# Patient Record
Sex: Male | Born: 1970
Health system: Southern US, Community
[De-identification: ages and names within clinical notes are randomized; demographics above are authoritative.]

## PROBLEM LIST (undated history)

## (undated) DIAGNOSIS — T7840XA Allergy, unspecified, initial encounter: Secondary | ICD-10-CM

## (undated) DIAGNOSIS — E119 Type 2 diabetes mellitus without complications: Secondary | ICD-10-CM

## (undated) DIAGNOSIS — F32A Depression, unspecified: Secondary | ICD-10-CM

## (undated) DIAGNOSIS — F419 Anxiety disorder, unspecified: Secondary | ICD-10-CM

## (undated) DIAGNOSIS — R569 Unspecified convulsions: Secondary | ICD-10-CM

## (undated) DIAGNOSIS — F329 Major depressive disorder, single episode, unspecified: Secondary | ICD-10-CM

## (undated) HISTORY — DX: Unspecified convulsions: R56.9

## (undated) HISTORY — DX: Major depressive disorder, single episode, unspecified: F32.9

## (undated) HISTORY — DX: Depression, unspecified: F32.A

## (undated) HISTORY — DX: Anxiety disorder, unspecified: F41.9

## (undated) HISTORY — PX: WISDOM TOOTH EXTRACTION: SHX21

## (undated) HISTORY — DX: Allergy, unspecified, initial encounter: T78.40XA

---

## 2015-02-26 ENCOUNTER — Emergency Department (HOSPITAL_COMMUNITY)
Admission: EM | Admit: 2015-02-26 | Discharge: 2015-02-26 | Disposition: A | Discharge status: Home / Self Care | Payer: BLUE CROSS/BLUE SHIELD | Attending: Emergency Medicine | Admitting: Emergency Medicine

## 2015-02-26 ENCOUNTER — Encounter (HOSPITAL_COMMUNITY): Payer: Self-pay | Admitting: Emergency Medicine

## 2015-02-26 DIAGNOSIS — S39012A Strain of muscle, fascia and tendon of lower back, initial encounter: Secondary | ICD-10-CM

## 2015-02-26 DIAGNOSIS — M546 Pain in thoracic spine: Secondary | ICD-10-CM

## 2015-02-26 DIAGNOSIS — Y9289 Other specified places as the place of occurrence of the external cause: Secondary | ICD-10-CM | POA: Insufficient documentation

## 2015-02-26 DIAGNOSIS — Y9389 Activity, other specified: Secondary | ICD-10-CM | POA: Insufficient documentation

## 2015-02-26 DIAGNOSIS — S29012A Strain of muscle and tendon of back wall of thorax, initial encounter: Secondary | ICD-10-CM | POA: Insufficient documentation

## 2015-02-26 DIAGNOSIS — E119 Type 2 diabetes mellitus without complications: Secondary | ICD-10-CM | POA: Diagnosis not present

## 2015-02-26 DIAGNOSIS — X58XXXA Exposure to other specified factors, initial encounter: Secondary | ICD-10-CM | POA: Insufficient documentation

## 2015-02-26 DIAGNOSIS — S299XXA Unspecified injury of thorax, initial encounter: Secondary | ICD-10-CM | POA: Diagnosis present

## 2015-02-26 DIAGNOSIS — Y998 Other external cause status: Secondary | ICD-10-CM | POA: Insufficient documentation

## 2015-02-26 HISTORY — DX: Type 2 diabetes mellitus without complications: E11.9

## 2015-02-26 MED ORDER — DIAZEPAM 5 MG/ML IJ SOLN: 5.0000 mg | Freq: Once | INTRAMUSCULAR | Status: DC

## 2015-02-26 MED ORDER — DIAZEPAM 5 MG PO TABS
5.0000 mg | ORAL_TABLET | Freq: Once | ORAL | Status: AC
  Administered 2015-02-26: 5 mg via ORAL
  Filled 2015-02-26: qty 1

## 2015-02-26 MED ORDER — DIAZEPAM 5 MG PO TABS: 5.0000 mg | ORAL_TABLET | Freq: Two times a day (BID) | ORAL | Status: DC | PRN

## 2015-02-26 MED ORDER — KETOROLAC TROMETHAMINE 60 MG/2ML IM SOLN
60.0000 mg | Freq: Once | INTRAMUSCULAR | Status: AC
  Administered 2015-02-26: 60 mg via INTRAMUSCULAR
  Filled 2015-02-26: qty 2

## 2015-02-26 MED ORDER — TRAMADOL-ACETAMINOPHEN 37.5-325 MG PO TABS: 1.0000 | ORAL_TABLET | Freq: Four times a day (QID) | ORAL | Status: DC | PRN

## 2015-02-26 NOTE — ED Notes (Signed)
Lisa, PA at the bedside.  

## 2015-02-26 NOTE — ED Provider Notes (Signed)
CSN: [id number]     Arrival date & time 02/26/15  0536 History   First MD Initiated Contact with Patient 02/26/15 0601     Chief Complaint  Patient presents with  . Back Pain     (Consider location/radiation/quality/duration/timing/severity/associated sxs/prior Treatment) Patient is a 44 y.o. male presenting with back pain. The history is provided by the patient and medical records.  Back Pain   This is a 44 year old male with past medical history significant for diabetes, presenting to the ED for back pain. Patient states pain actually began on Thursday, 3 days ago, but worsened yesterday after roughhousing on the floor with his daughter.  He states he bent over to pick her up and felt a "pull" in his back and has had pain ever since.  He states he does have history of back problems in the past.  He denies numbness or weakness of his extremities.  No loss of bowel or bladder control.  Denies fever, chills, sweats, urinary symptoms.  No chest pain, SOB, or abdominal pain.    Past Medical History  Diagnosis Date  . Diabetes mellitus without complication    Past Surgical History  Procedure Laterality Date  . Wisdom tooth extraction     History reviewed. No pertinent family history. History  Substance Use Topics  . Smoking status: Never Smoker   . Smokeless tobacco: Not on file  . Alcohol Use: Yes     Comment: Occasionally    Review of Systems  Musculoskeletal: Positive for back pain.  All other systems reviewed and are negative.     Allergies  Review of patient's allergies indicates no known allergies.  Home Medications   Prior to Admission medications   Not on File   BP 122/88 mmHg  Pulse 99  Temp(Src) 98.2 F (36.8 C) (Oral)  Resp 18  SpO2 95% Physical Exam  Constitutional: He is oriented to person, place, and time. He appears well-developed and well-nourished. No distress.  HENT:  Head: Normocephalic and atraumatic.  Mouth/Throat: Oropharynx is clear and  moist.  Eyes: Conjunctivae and EOM are normal. Pupils are equal, round, and reactive to light.  Neck: Normal range of motion. Neck supple.  Cardiovascular: Normal rate, regular rhythm, normal heart sounds, intact distal pulses and normal pulses.   Intact extremity pulses x4  Pulmonary/Chest: Effort normal and breath sounds normal. No respiratory distress. He has no wheezes.  Abdominal: Soft. Bowel sounds are normal. There is no tenderness. There is no guarding.  Musculoskeletal: He exhibits no edema.       Thoracic back: He exhibits decreased range of motion (due to pain), tenderness, pain and spasm.       Back:  Reproducible tenderness of left thoracic paraspinal muscles with spasm present; no midline tenderness or deformity; decreased ROM due to pain; normal strength and sensataion of all 4 extremities  Neurological: He is alert and oriented to person, place, and time.  Skin: Skin is warm and dry. He is not diaphoretic.  Psychiatric: He has a normal mood and affect.  Nursing note and vitals reviewed.   ED Course  Procedures (including critical care time) Labs Review Labs Reviewed - No data to display  Imaging Review No results found.   EKG Interpretation   Date/Time:  Sunday Feb 26 2015 05:56:49 EDT Ventricular Rate:  97 PR Interval:  148 QRS Duration: 81 QT Interval:  325 QTC Calculation: 413 R Axis:   50 Text Interpretation:  Sinus rhythm No old tracing to compare Confirmed  by  Jadene PieriniWARD,  DO, KRISTEN [phone]([id number]) on 02/26/2015 5:59:16 AM      MDM   Final diagnoses:  Back strain, initial encounter  Left-sided thoracic back pain   44 y.o. M here with back pain which he describes as tightness after picking up his daughter.  Hx of back issues in the past.  Patient afebrile, non-toxic.  Reproducible tenderness of left thoracic paraspinal muscles without midline tenderness or deformity.  He has decreased ROM due to pain but maintains normal ROM, strength, and sensation of all 4  extremities.  No focal neurologic deficits to suggest central cord, cauda equina, or other acute spinal etiology.  No abdominal pain, CP, or SOB-- doubt AAA/dissection, ACS, or PE.  Patient was given toradol and valium with significant improvement of his symptoms.  He feels he can rest comfortably at home and is ready to be discharged.  He remains without focal neurologic deficits.  Will d/c home with valium and ultracet as he states he had intolerance to codeine in the past.  Discussed plan with patient, he/she acknowledged understanding and agreed with plan of care.  Return precautions given for new or worsening symptoms.  Garlon HatchetLisa M Mikail Goostree, PA-C 02/26/15 [phone]  Layla MawKristen N Ward, DO 02/27/15 [phone]

## 2015-02-26 NOTE — Discharge Instructions (Signed)
Take the prescribed medication as directed.  May wish to apply heat to back to help with muscle soreness. Return to the ED for new or worsening symptoms.

## 2015-02-26 NOTE — ED Notes (Signed)
Pt reports pain on his left back that started on Thursday, but worsened yesterday. Pt states that the pain is worse with inspiration. Pt states that the pain gets better when he leans forward.

## 2016-07-19 ENCOUNTER — Emergency Department (HOSPITAL_COMMUNITY): Payer: Medicare Other

## 2016-07-19 ENCOUNTER — Encounter (HOSPITAL_COMMUNITY): Payer: Self-pay | Admitting: *Deleted

## 2016-07-19 ENCOUNTER — Inpatient Hospital Stay (HOSPITAL_COMMUNITY)
Admission: EM | Admit: 2016-07-19 | Discharge: 2016-07-24 | DRG: 853 | Disposition: A | Discharge status: Home / Self Care | Payer: Medicare Other | Attending: Family Medicine | Admitting: Family Medicine

## 2016-07-19 DIAGNOSIS — R221 Localized swelling, mass and lump, neck: Secondary | ICD-10-CM | POA: Diagnosis not present

## 2016-07-19 DIAGNOSIS — A419 Sepsis, unspecified organism: Principal | ICD-10-CM | POA: Diagnosis present

## 2016-07-19 DIAGNOSIS — E1310 Other specified diabetes mellitus with ketoacidosis without coma: Secondary | ICD-10-CM

## 2016-07-19 DIAGNOSIS — L0390 Cellulitis, unspecified: Secondary | ICD-10-CM | POA: Diagnosis present

## 2016-07-19 DIAGNOSIS — E1110 Type 2 diabetes mellitus with ketoacidosis without coma: Secondary | ICD-10-CM | POA: Diagnosis present

## 2016-07-19 DIAGNOSIS — L0291 Cutaneous abscess, unspecified: Secondary | ICD-10-CM | POA: Diagnosis not present

## 2016-07-19 DIAGNOSIS — B9561 Methicillin susceptible Staphylococcus aureus infection as the cause of diseases classified elsewhere: Secondary | ICD-10-CM | POA: Diagnosis present

## 2016-07-19 DIAGNOSIS — L0211 Cutaneous abscess of neck: Secondary | ICD-10-CM | POA: Diagnosis not present

## 2016-07-19 DIAGNOSIS — E119 Type 2 diabetes mellitus without complications: Secondary | ICD-10-CM | POA: Diagnosis not present

## 2016-07-19 DIAGNOSIS — L03221 Cellulitis of neck: Secondary | ICD-10-CM | POA: Diagnosis not present

## 2016-07-19 DIAGNOSIS — Z823 Family history of stroke: Secondary | ICD-10-CM

## 2016-07-19 DIAGNOSIS — E1149 Type 2 diabetes mellitus with other diabetic neurological complication: Secondary | ICD-10-CM

## 2016-07-19 LAB — CBC WITH DIFFERENTIAL/PLATELET
Basophils Absolute: 0 10*3/uL (ref 0.0–0.1)
Basophils Relative: 0 %
Eosinophils Absolute: 0 10*3/uL (ref 0.0–0.7)
Eosinophils Relative: 0 %
HEMATOCRIT: 40.6 % (ref 39.0–52.0)
Hemoglobin: 14.6 g/dL (ref 13.0–17.0)
LYMPHS ABS: 1.7 10*3/uL (ref 0.7–4.0)
LYMPHS PCT: 10 %
MCH: 29.9 pg (ref 26.0–34.0)
MCHC: 36 g/dL (ref 30.0–36.0)
MCV: 83.2 fL (ref 78.0–100.0)
MONOS PCT: 7 %
Monocytes Absolute: 1.2 10*3/uL — ABNORMAL HIGH (ref 0.1–1.0)
NEUTROS ABS: 13.9 10*3/uL — AB (ref 1.7–7.7)
NEUTROS PCT: 83 %
Platelets: 258 10*3/uL (ref 150–400)
RBC: 4.88 MIL/uL (ref 4.22–5.81)
RDW: 11.9 % (ref 11.5–15.5)
WBC: 16.8 10*3/uL — AB (ref 4.0–10.5)

## 2016-07-19 LAB — BASIC METABOLIC PANEL
ANION GAP: 9 (ref 5–15)
Anion gap: 18 — ABNORMAL HIGH (ref 5–15)
Anion gap: 15 (ref 5–15)
Anion gap: 14 (ref 5–15)
BUN: 15 mg/dL (ref 6–20)
BUN: 12 mg/dL (ref 6–20)
BUN: 13 mg/dL (ref 6–20)
BUN: 10 mg/dL (ref 6–20)
CALCIUM: 8.7 mg/dL — AB (ref 8.9–10.3)
CALCIUM: 8.4 mg/dL — AB (ref 8.9–10.3)
CHLORIDE: 99 mmol/L — AB (ref 101–111)
CHLORIDE: 101 mmol/L (ref 101–111)
CHLORIDE: 104 mmol/L (ref 101–111)
CO2: 18 mmol/L — AB (ref 22–32)
CO2: 21 mmol/L — AB (ref 22–32)
CO2: 18 mmol/L — AB (ref 22–32)
CO2: 22 mmol/L (ref 22–32)
Calcium: 9.2 mg/dL (ref 8.9–10.3)
Calcium: 8.9 mg/dL (ref 8.9–10.3)
Chloride: 96 mmol/L — ABNORMAL LOW (ref 101–111)
Creatinine, Ser: 1.17 mg/dL (ref 0.61–1.24)
Creatinine, Ser: 1.1 mg/dL (ref 0.61–1.24)
Creatinine, Ser: 1.02 mg/dL (ref 0.61–1.24)
Creatinine, Ser: 0.94 mg/dL (ref 0.61–1.24)
GFR calc Af Amer: >60 mL/min (ref >60)
GFR calc Af Amer: >60 mL/min (ref >60)
GFR calc Af Amer: >60 mL/min (ref >60)
GFR calc non Af Amer: >60 mL/min (ref >60)
GFR calc non Af Amer: >60 mL/min (ref >60)
GFR calc non Af Amer: >60 mL/min (ref >60)
GFR calc non Af Amer: >60 mL/min (ref >60)
GLUCOSE: 259 mg/dL — AB (ref 65–99)
GLUCOSE: 233 mg/dL — AB (ref 65–99)
Glucose, Bld: 291 mg/dL — ABNORMAL HIGH (ref 65–99)
Glucose, Bld: 254 mg/dL — ABNORMAL HIGH (ref 65–99)
POTASSIUM: 3.5 mmol/L (ref 3.5–5.1)
Potassium: 3.3 mmol/L — ABNORMAL LOW (ref 3.5–5.1)
Potassium: 3.3 mmol/L — ABNORMAL LOW (ref 3.5–5.1)
Potassium: 3.3 mmol/L — ABNORMAL LOW (ref 3.5–5.1)
SODIUM: 132 mmol/L — AB (ref 135–145)
Sodium: 135 mmol/L (ref 135–145)
Sodium: 133 mmol/L — ABNORMAL LOW (ref 135–145)
Sodium: 135 mmol/L (ref 135–145)

## 2016-07-19 LAB — URINE MICROSCOPIC-ADD ON: RBC / HPF: NONE SEEN RBC/hpf (ref 0–5)

## 2016-07-19 LAB — I-STAT VENOUS BLOOD GAS, ED
ACID-BASE DEFICIT: 7 mmol/L — AB (ref 0.0–2.0)
BICARBONATE: 18.8 mmol/L — AB (ref 20.0–28.0)
O2 Saturation: 27 %
PCO2 VEN: 40 mmHg — AB (ref 44.0–60.0)
PH VEN: 7.28 (ref 7.250–7.430)
PO2 VEN: 20 mmHg — AB (ref 32.0–45.0)
TCO2: 20 mmol/L (ref 0–100)

## 2016-07-19 LAB — URINALYSIS, ROUTINE W REFLEX MICROSCOPIC
Glucose, UA: >1000 mg/dL — AB
HGB URINE DIPSTICK: NEGATIVE
Ketones, ur: >80 mg/dL — AB
Leukocytes, UA: NEGATIVE
Nitrite: NEGATIVE
PH: 5.5 (ref 5.0–8.0)
Protein, ur: 30 mg/dL — AB
SPECIFIC GRAVITY, URINE: 1.02 (ref 1.005–1.030)

## 2016-07-19 LAB — CBG MONITORING, ED
GLUCOSE-CAPILLARY: 214 mg/dL — AB (ref 65–99)
Glucose-Capillary: 216 mg/dL — ABNORMAL HIGH (ref 65–99)
Glucose-Capillary: 246 mg/dL — ABNORMAL HIGH (ref 65–99)
Glucose-Capillary: 256 mg/dL — ABNORMAL HIGH (ref 65–99)

## 2016-07-19 LAB — GLUCOSE, CAPILLARY: Glucose-Capillary: 223 mg/dL — ABNORMAL HIGH (ref 65–99)

## 2016-07-19 LAB — LACTIC ACID, PLASMA
LACTIC ACID, VENOUS: 1 mmol/L (ref 0.5–1.9)
Lactic Acid, Venous: 1.1 mmol/L (ref 0.5–1.9)

## 2016-07-19 MED ORDER — ONDANSETRON HCL 4 MG/2ML IJ SOLN: 4.0000 mg | Freq: Four times a day (QID) | INTRAMUSCULAR | Status: DC | PRN

## 2016-07-19 MED ORDER — ACETAMINOPHEN 325 MG PO TABS
650.0000 mg | ORAL_TABLET | Freq: Four times a day (QID) | ORAL | Status: DC | PRN
  Administered 2016-07-20 – 2016-07-23 (×2): 650 mg via ORAL
  Filled 2016-07-19 (×2): qty 2

## 2016-07-19 MED ORDER — VANCOMYCIN HCL 10 G IV SOLR
1500.0000 mg | Freq: Once | INTRAVENOUS | Status: AC
  Administered 2016-07-19: 1500 mg via INTRAVENOUS
  Filled 2016-07-19: qty 1500

## 2016-07-19 MED ORDER — SODIUM CHLORIDE 0.9 % IV SOLN: INTRAVENOUS | Status: DC

## 2016-07-19 MED ORDER — KETOROLAC TROMETHAMINE 15 MG/ML IJ SOLN
15.0000 mg | Freq: Once | INTRAMUSCULAR | Status: AC
  Administered 2016-07-19: 15 mg via INTRAVENOUS
  Filled 2016-07-19: qty 1

## 2016-07-19 MED ORDER — ACETAMINOPHEN 650 MG RE SUPP: 650.0000 mg | Freq: Four times a day (QID) | RECTAL | Status: DC | PRN

## 2016-07-19 MED ORDER — POTASSIUM CHLORIDE 10 MEQ/100ML IV SOLN: 10.0000 meq | INTRAVENOUS | Status: DC

## 2016-07-19 MED ORDER — KETOROLAC TROMETHAMINE 30 MG/ML IJ SOLN
INTRAMUSCULAR | Status: AC
  Filled 2016-07-19: qty 1

## 2016-07-19 MED ORDER — KETOROLAC TROMETHAMINE 30 MG/ML IJ SOLN
30.0000 mg | Freq: Four times a day (QID) | INTRAMUSCULAR | Status: AC | PRN
Start: 2016-07-19 — End: 2016-07-23
  Administered 2016-07-19 – 2016-07-23 (×11): 30 mg via INTRAVENOUS
  Filled 2016-07-19 (×10): qty 1

## 2016-07-19 MED ORDER — VANCOMYCIN HCL IN DEXTROSE 1-5 GM/200ML-% IV SOLN: 1000.0000 mg | Freq: Once | INTRAVENOUS | Status: DC

## 2016-07-19 MED ORDER — DEXTROSE-NACL 5-0.45 % IV SOLN: INTRAVENOUS | Status: DC

## 2016-07-19 MED ORDER — SODIUM CHLORIDE 0.9 % IV SOLN
INTRAVENOUS | Status: DC
  Administered 2016-07-19: 1.9 [IU]/h via INTRAVENOUS
  Filled 2016-07-19: qty 2.5

## 2016-07-19 MED ORDER — VANCOMYCIN HCL IN DEXTROSE 1-5 GM/200ML-% IV SOLN
1000.0000 mg | Freq: Two times a day (BID) | INTRAVENOUS | Status: DC
  Administered 2016-07-20 – 2016-07-21 (×4): 1000 mg via INTRAVENOUS
  Filled 2016-07-19 (×5): qty 200

## 2016-07-19 MED ORDER — DEXTROSE-NACL 5-0.45 % IV SOLN
INTRAVENOUS | Status: DC
  Administered 2016-07-19 – 2016-07-20 (×2): via INTRAVENOUS

## 2016-07-19 MED ORDER — SODIUM CHLORIDE 0.9 % IV BOLUS (SEPSIS)
1000.0000 mL | Freq: Once | INTRAVENOUS | Status: AC
  Administered 2016-07-19: 1000 mL via INTRAVENOUS

## 2016-07-19 MED ORDER — SODIUM CHLORIDE 0.9 % IV SOLN
INTRAVENOUS | Status: DC
  Administered 2016-07-19: 19:00:00 via INTRAVENOUS

## 2016-07-19 MED ORDER — POTASSIUM CHLORIDE 10 MEQ/100ML IV SOLN
10.0000 meq | INTRAVENOUS | Status: DC
  Filled 2016-07-19: qty 100

## 2016-07-19 MED ORDER — POTASSIUM CHLORIDE 10 MEQ/100ML IV SOLN
10.0000 meq | INTRAVENOUS | Status: AC
  Administered 2016-07-19 (×2): 10 meq via INTRAVENOUS
  Filled 2016-07-19: qty 100

## 2016-07-19 MED ORDER — LIDOCAINE HCL (PF) 1 % IJ SOLN
5.0000 mL | Freq: Once | INTRAMUSCULAR | Status: AC
  Administered 2016-07-19: 5 mL
  Filled 2016-07-19: qty 5

## 2016-07-19 MED ORDER — POTASSIUM CHLORIDE 10 MEQ/100ML IV SOLN
10.0000 meq | INTRAVENOUS | Status: AC
  Administered 2016-07-19 – 2016-07-20 (×2): 10 meq via INTRAVENOUS
  Filled 2016-07-19 (×2): qty 100

## 2016-07-19 MED ORDER — ONDANSETRON HCL 4 MG PO TABS: 4.0000 mg | ORAL_TABLET | Freq: Four times a day (QID) | ORAL | Status: DC | PRN

## 2016-07-19 MED ORDER — IOPAMIDOL (ISOVUE-300) INJECTION 61%
INTRAVENOUS | Status: AC
  Administered 2016-07-19: 75 mL via INTRAVENOUS
  Filled 2016-07-19: qty 75

## 2016-07-19 MED ORDER — SODIUM CHLORIDE 0.9% FLUSH
3.0000 mL | Freq: Two times a day (BID) | INTRAVENOUS | Status: DC
  Administered 2016-07-19 – 2016-07-24 (×8): 3 mL via INTRAVENOUS

## 2016-07-19 MED ORDER — SODIUM CHLORIDE 0.9 % IV SOLN
INTRAVENOUS | Status: DC
  Filled 2016-07-19: qty 2.5

## 2016-07-19 NOTE — ED Provider Notes (Signed)
[facility]-EMERGENCY DEPT Provider Note   CSN: [id number] Arrival date & time: 07/19/16  0857     History   Chief Complaint Chief Complaint  Patient presents with  . Abscess    HPI Jimmy Castillo is a 45 year old man with history of diabetes.  HPI  He presents with abscess that he first noticed Friday or Saturday. Did not notice any lesions prior. He has had a similar abscess two years ago. He had it drained at that time. He took antibiotics at the time. Denies IV drugs.   He does not take anything for his diabetes. Does not have a regular doctor. Has not had A1c checked in a couple of years. He is having polyuria and polydipsia since the abscess appeared.  Per wife, he previously had high blood glucose measurements.  Past Medical History:  Diagnosis Date  . Diabetes mellitus without complication [facility])     Past Surgical History:  Procedure Laterality Date  . WISDOM TOOTH EXTRACTION         Home Medications    Prior to Admission medications   Medication Sig Start Date End Date Taking? Authorizing Provider  diphenhydrAMINE (BENADRYL) 25 mg capsule Take 25 mg by mouth every 6 (six) hours as needed for itching.   Yes Historical Provider, MD  ibuprofen (ADVIL,MOTRIN) 200 MG tablet Take 200 mg by mouth every 6 (six) hours as needed for moderate pain.   Yes Historical Provider, MD  diazepam (VALIUM) 5 MG tablet Take 1 tablet (5 mg total) by mouth every 12 (twelve) hours as needed for muscle spasms. Patient not taking: Reported on 07/19/2016 02/26/15   Garlon Hatchet, PA-C  traMADol-acetaminophen (ULTRACET) 37.5-325 MG per tablet Take 1 tablet by mouth every 6 (six) hours as needed. Patient not taking: Reported on 07/19/2016 02/26/15   Garlon Hatchet, PA-C    Family History No family history on file.  CAD and lung disease but uncertain who  Social History Social History  Substance Use Topics  . Smoking status: Never Smoker  . Smokeless tobacco: Never Used  . Alcohol use Yes       Comment: Occasionally     Allergies   Codeine and Other   Review of Systems Review of Systems Constitutional: +subjective fevers/chills Eyes: +vision changes (feels off) associated with headache Ears, nose, mouth, throat, and face: no cough Respiratory: no shortness of breath Cardiovascular: no chest pain Gastrointestinal: no nausea/vomiting, no abdominal pain, no constipation, no diarrhea Genitourinary: no dysuria, no hematuria Integument: no rash Hematologic/lymphatic: no bleeding/bruising, no edema Musculoskeletal: no arthralgias, no myalgias Neurological: no paresthesias, no weakness   Physical Exam Updated Vital Signs BP 104/62   Pulse 102   Temp 98 F (36.7 C)   Resp 16   Ht 5\' 8"  (1.727 m)   Wt 81.6 kg   SpO2 99%   BMI 27.37 kg/m   Physical Exam General Apperance: NAD Head: Normocephalic, atraumatic Eyes: PERRL, EOMI, anicteric sclera Ears: Normal external ear canal Nose: Nares normal, septum midline, mucosa normal Throat: Lips, mucosa and tongue normal  Neck: Posterior neck with 8cm abscess with purulent drainage surrounding erythema. Back: No tenderness or bony abnormality  Lungs: Clear to auscultation bilaterally. No wheezes, rhonchi or rales. Breathing comfortably Chest Wall: Nontender, no deformity Heart: Regular rate and rhythm, no murmur/rub/gallop Abdomen: Soft, nontender, nondistended, no rebound/guarding Extremities: Normal, atraumatic, warm and well perfused, no edema Pulses: 2+ throughout Skin: No rashes Neurologic: Alert and oriented x 3. CNII-XII intact. Normal strength and sensation  ED Treatments / Results  Labs (all labs ordered are listed, but only abnormal results are displayed) Labs Reviewed  BASIC METABOLIC PANEL - Abnormal; Notable for the following:       Result Value   Sodium 132 (*)    Chloride 96 (*)    CO2 18 (*)    Glucose, Bld 291 (*)    Anion gap 18 (*)    All other components within normal limits  CBC WITH  DIFFERENTIAL/PLATELET - Abnormal; Notable for the following:    WBC 16.8 (*)    Neutro Abs 13.9 (*)    Monocytes Absolute 1.2 (*)    All other components within normal limits  URINALYSIS, ROUTINE W REFLEX MICROSCOPIC (NOT AT [facility])  CBG MONITORING, ED  I-STAT ARTERIAL BLOOD GAS, ED    Radiology Ct Soft Tissue Neck W Contrast  Result Date: 07/19/2016 CLINICAL DATA:  Draining sole or of the posterior neck. 3-5 day history. EXAM: CT NECK WITH CONTRAST TECHNIQUE: Multidetector CT imaging of the neck was performed using the standard protocol following the bolus administration of intravenous contrast. CONTRAST:  75 cc Isovue-300 COMPARISON:  None. FINDINGS: Pharynx and larynx: No mucosal or submucosal lesion. Salivary glands: Submandibular and parotid glands are normal. Thyroid: Normal. Lymph nodes: Mild reactive nodal prominence bilaterally, left more than right. No to dominant enlarged or low-density node. Vascular: Normal Limited intracranial: Normal Visualized orbits: Normal Mastoids and visualized paranasal sinuses: Normal Skeleton: No significant finding. Probable hemangioma within the C6 vertebral body. Upper chest: Normal Other: There is nonspecific soft tissue swelling within the subcutaneous fat of the left posterior neck from the skullbase to the C6 level consistent with nonspecific cellulitis. Some inflammation affects the paraspinous musculature on the left. No evidence of nonenhancing low-density collection to suggest drainable abscess. IMPRESSION: Nonspecific soft tissue swelling within the subcutaneous fat at the left posterior neck from the skullbase to the C6 level. Some involvement of the posterior paraspinous musculature on the left. No evidence of well-circumscribed nonenhancing collection to suggest drainable abscess. Findings are consistent with significant cellulitis. Electronically Signed   By: Paulina Fusi M.D.   On: 07/19/2016 11:33    Procedures .Marland KitchenIncision and  Drainage Date/Time: 07/19/2016 1:00 PM Performed by: Griffin Basil T Authorized by: Griffin Basil T   Consent:    Consent obtained:  Verbal   Consent given by:  Patient   Risks discussed:  Bleeding, incomplete drainage, pain, damage to other organs and infection   Alternatives discussed:  Observation Location:    Type:  Abscess   Location:  Neck   Neck location:  L posterior Pre-procedure details:    Skin preparation:  Betadine Anesthesia (see MAR for exact dosages):    Anesthesia method:  Local infiltration   Local anesthetic:  Lidocaine 1% w/o epi Procedure type:    Complexity:  Simple Procedure details:    Incision types:  Single straight   Incision depth:  Subcutaneous   Scalpel blade:  11   Wound management:  Probed and deloculated   Drainage:  Purulent   Drainage amount:  Moderate   Wound treatment:  Wound left open   Packing materials:  None Post-procedure details:    Patient tolerance of procedure:  Tolerated well, no immediate complications    Medications Ordered in ED Medications  vancomycin (VANCOCIN) 1,500 mg in sodium chloride 0.9 % 500 mL IVPB (1,500 mg Intravenous New Bag/Given 07/19/16 1243)  vancomycin (VANCOCIN) IVPB 1000 mg/200 mL premix (not administered)  iopamidol (ISOVUE-300) 61 %  injection (75 mLs Intravenous Contrast Given 07/19/16 1105)  ketorolac (TORADOL) 15 MG/ML injection 15 mg (15 mg Intravenous Given 07/19/16 1144)  sodium chloride 0.9 % bolus 1,000 mL (1,000 mLs Intravenous New Bag/Given 07/19/16 1251)  lidocaine (PF) (XYLOCAINE) 1 % injection 5 mL (5 mLs Infiltration Given 07/19/16 1231)     Initial Impression / Assessment and Plan / ED Course  I have reviewed the triage vital signs and the nursing notes.  Pertinent labs & imaging results that were available during my care of the patient were reviewed by me and considered in my medical decision making (see chart for details).  Clinical Course   10:30am evaluated pt, bedside  ultrasound with extensive abscess. Ordered CT neck. 12:15pm IV vanc ordered in addition to 1L NS bolus. 12:55pm I&D performed. Discussed with admitting team. UA and ABG ordered per admitting team request.  Final Clinical Impressions(s) / ED Diagnoses   Final diagnoses:  Abscess  Afebrile with leukocytosis of 16.8. Patient is tachycardic. Extensive soft tissue swelling at the left posterior neck from skull base to C6 level with some involvement of posterior paraspinous musculature on left. Purulent drainage present. Will extend opening of wound to facilitate drainage. Will start on IV Vanc and admit given uncontrolled diabetes and severity of disease. Metabolic acidosis present with anion gap of 18. Blood glucose of 291. 1L NS administered.  New Prescriptions New Prescriptions   No medications on file     Lora PaulaJennifer T Krall, MD 07/19/16 1311    Lyndal Pulleyaniel Knott, MD 07/19/16 [phone]

## 2016-07-19 NOTE — Progress Notes (Signed)
Pharmacy Antibiotic Note  Jimmy SacksBrian Castillo is a 45 y.o. male admitted on 07/19/2016 with cellulitis.  Pharmacy has been consulted for vancomycin dosing. Pt is afebrile, WBC is elevated and SCr is WNL.   Plan: - Vancomycin 1500mg  IV x 1 then 1gm IV Q12H - F/u renal fxn, C&S, clinical status and trough at SS  Height: 5\' 8"  (172.7 cm) Weight: 180 lb (81.6 kg) IBW/kg (Calculated) : 68.4  Temp (24hrs), Avg:98 F (36.7 C), Min:98 F (36.7 C), Max:98 F (36.7 C)   Recent Labs Lab 07/19/16 1043  WBC 16.8*  CREATININE 1.17    Estimated Creatinine Clearance: 76.3 mL/min (by C-G formula based on SCr of 1.17 mg/dL).    Allergies  Allergen Reactions  . Codeine Other (See Comments)    hallucinations  . Other Other (See Comments)    Pt allergic to all opiates - says has "weird effects on him"    Antimicrobials this admission: Vanc 10/13>>  Dose adjustments this admission: N/A  Microbiology results: Pending  Thank you for allowing pharmacy to be a part of this patient's care.  Jimmy Castillo, Jimmy LeachRachel Castillo 07/19/2016 12:28 PM

## 2016-07-19 NOTE — ED Notes (Signed)
Report given to 4E RN.  Patient is stable for transport at this time.  Barb RN took patient to 4E

## 2016-07-19 NOTE — ED Triage Notes (Signed)
Pt reports R neck abscess for over a week, has been draining  Tuesday.  Pt is a diabetic.  Pt reports severe pain, he is unable to turn his head.

## 2016-07-19 NOTE — ED Notes (Signed)
Pt transported to CT ?
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
## 2016-07-20 LAB — BASIC METABOLIC PANEL
ANION GAP: 9 (ref 5–15)
ANION GAP: 10 (ref 5–15)
Anion gap: 8 (ref 5–15)
BUN: 10 mg/dL (ref 6–20)
BUN: 10 mg/dL (ref 6–20)
BUN: 10 mg/dL (ref 6–20)
CALCIUM: 8.5 mg/dL — AB (ref 8.9–10.3)
CHLORIDE: 105 mmol/L (ref 101–111)
CHLORIDE: 104 mmol/L (ref 101–111)
CO2: 21 mmol/L — ABNORMAL LOW (ref 22–32)
CO2: 23 mmol/L (ref 22–32)
CO2: 22 mmol/L (ref 22–32)
Calcium: 8.4 mg/dL — ABNORMAL LOW (ref 8.9–10.3)
Calcium: 8.5 mg/dL — ABNORMAL LOW (ref 8.9–10.3)
Chloride: 103 mmol/L (ref 101–111)
Creatinine, Ser: 0.94 mg/dL (ref 0.61–1.24)
Creatinine, Ser: 0.74 mg/dL (ref 0.61–1.24)
Creatinine, Ser: 0.87 mg/dL (ref 0.61–1.24)
Glucose, Bld: 234 mg/dL — ABNORMAL HIGH (ref 65–99)
Glucose, Bld: 162 mg/dL — ABNORMAL HIGH (ref 65–99)
Glucose, Bld: 207 mg/dL — ABNORMAL HIGH (ref 65–99)
POTASSIUM: 3.5 mmol/L (ref 3.5–5.1)
POTASSIUM: 3.3 mmol/L — AB (ref 3.5–5.1)
Potassium: 3.4 mmol/L — ABNORMAL LOW (ref 3.5–5.1)
SODIUM: 135 mmol/L (ref 135–145)
SODIUM: 135 mmol/L (ref 135–145)
SODIUM: 135 mmol/L (ref 135–145)

## 2016-07-20 LAB — MRSA PCR SCREENING: MRSA by PCR: NEGATIVE

## 2016-07-20 LAB — GLUCOSE, CAPILLARY
GLUCOSE-CAPILLARY: 154 mg/dL — AB (ref 65–99)
GLUCOSE-CAPILLARY: 174 mg/dL — AB (ref 65–99)
GLUCOSE-CAPILLARY: 191 mg/dL — AB (ref 65–99)
GLUCOSE-CAPILLARY: 223 mg/dL — AB (ref 65–99)
GLUCOSE-CAPILLARY: 227 mg/dL — AB (ref 65–99)
GLUCOSE-CAPILLARY: 241 mg/dL — AB (ref 65–99)
Glucose-Capillary: 235 mg/dL — ABNORMAL HIGH (ref 65–99)
Glucose-Capillary: 206 mg/dL — ABNORMAL HIGH (ref 65–99)
Glucose-Capillary: 147 mg/dL — ABNORMAL HIGH (ref 65–99)
Glucose-Capillary: 110 mg/dL — ABNORMAL HIGH (ref 65–99)
Glucose-Capillary: 321 mg/dL — ABNORMAL HIGH (ref 65–99)
Glucose-Capillary: 262 mg/dL — ABNORMAL HIGH (ref 65–99)

## 2016-07-20 LAB — CBC
HEMATOCRIT: 37.2 % — AB (ref 39.0–52.0)
Hemoglobin: 13.1 g/dL (ref 13.0–17.0)
MCH: 29.2 pg (ref 26.0–34.0)
MCHC: 35.2 g/dL (ref 30.0–36.0)
MCV: 83 fL (ref 78.0–100.0)
PLATELETS: 241 10*3/uL (ref 150–400)
RBC: 4.48 MIL/uL (ref 4.22–5.81)
RDW: 11.9 % (ref 11.5–15.5)
WBC: 14.8 10*3/uL — AB (ref 4.0–10.5)

## 2016-07-20 MED ORDER — SODIUM CHLORIDE 0.9 % IV SOLN
Freq: Once | INTRAVENOUS | Status: AC
  Administered 2016-07-20: 08:00:00 via INTRAVENOUS

## 2016-07-20 MED ORDER — POTASSIUM CHLORIDE CRYS ER 20 MEQ PO TBCR
40.0000 meq | EXTENDED_RELEASE_TABLET | Freq: Once | ORAL | Status: AC
  Administered 2016-07-20: 40 meq via ORAL
  Filled 2016-07-20: qty 2

## 2016-07-20 MED ORDER — INSULIN ASPART 100 UNIT/ML ~~LOC~~ SOLN
0.0000 [IU] | Freq: Three times a day (TID) | SUBCUTANEOUS | Status: DC
  Administered 2016-07-20: 11 [IU] via SUBCUTANEOUS
  Administered 2016-07-20: 5 [IU] via SUBCUTANEOUS
  Administered 2016-07-21 (×3): 8 [IU] via SUBCUTANEOUS
  Administered 2016-07-22 (×2): 5 [IU] via SUBCUTANEOUS
  Administered 2016-07-22: 8 [IU] via SUBCUTANEOUS
  Administered 2016-07-23 – 2016-07-24 (×4): 5 [IU] via SUBCUTANEOUS
  Administered 2016-07-24: 3 [IU] via SUBCUTANEOUS
  Administered 2016-07-24: 8 [IU] via SUBCUTANEOUS

## 2016-07-20 MED ORDER — INSULIN GLARGINE 100 UNIT/ML ~~LOC~~ SOLN
10.0000 [IU] | Freq: Every day | SUBCUTANEOUS | Status: DC
  Administered 2016-07-20: 10 [IU] via SUBCUTANEOUS
  Filled 2016-07-20 (×2): qty 0.1

## 2016-07-20 NOTE — Progress Notes (Addendum)
Family Medicine Teaching Service Daily Progress Note Intern Pager: [phone]  Patient name: Jimmy SacksBrian Galan Medical record number: [id number] Date of birth: 1971-07-19 Age: 45 y.o. Gender: male  Primary Care Provider: Pcp Not In System Consultants: None Code Status: Full  Pt Overview and Major Events to Date:    Assessment and Plan:  #Left neck abscess/Cellulitis Abscess continues to drain pus discharge, surrounding erythema not enlarging over the last 24 hours.  WBC count trending down from 16.8 to 14.8. Vitals overall have remained stable   --Continue Vancomycin 1000 mg BID, per pharmacy  --F/u on blood cultures, though blood cultures obtained after antibiotics had already been obtained   -- CBC tomorrow  --Would consider surgery consult with worsening pain and cellulitis --Zofran 4 mg q 6 prn --Tylenol 650 mg q6 prn --Continue to monitor vitals - Wound care for abscess   #DKA, mild Only a slight AG noted on admission. Patient was on insulin drip overnight, AG closed x 2 later that night. Patient was transitioned this morning to subQ insulin.  -  By 7 AM this AM about 42 units of insulin - conservatively 10 units of Lantus was started with Moderate SSI  - CBGs ACQHS - D/C insulin drip, D5NS,  --f/u on A1c --Diabetic education -- Case management for financial assistant with medication and following up with physician  - Potassium 3.3 this AM - replete with KDUR 40, follow BMET   FEN/GI: Carb modified, NS 125 cc/hr Prophylaxis: SCD as patient allergic to pork products   Disposition: Home  Subjective:  Patient doing well. Denies any fever or chills. Patient has  Good appetite this morning. Interested in figure out how to better manage his diabetes. Indicates he would likely need assistance in obtain any medications   Objective: Temp:  [98 F (36.7 C)-99.7 F (37.6 C)] 98.2 F (36.8 C) (10/14 0714) Pulse Rate:  [86-114] 86 (10/14 0714) Resp:  [16-23] 23 (10/14 0714) BP:  (102-135)/(62-93) 108/81 (10/14 0714) SpO2:  [95 %-100 %] 99 % (10/14 0417) Weight:  [189 lb 6 oz (85.9 kg)] 189 lb 6 oz (85.9 kg) (10/13 2126) Physical Exam: General: NAD, sitting up in bed on his computer  Cardiovascular: RRR, no murmurs  Respiratory: CTAB, no signs of acute distress  Abdomen: BS+, no ttp Skin: Abscess with pus drainage from site, surrounding erythema unchanged remains with borders of the marker.   Laboratory:  Recent Labs Lab 07/19/16 1043 07/20/16 0555  WBC 16.8* 14.8*  HGB 14.6 13.1  HCT 40.6 37.2*  PLT 258 241    Recent Labs Lab 07/20/16 0205 07/20/16 0555 07/20/16 1011  NA 135 135 135  K 3.5 3.3* 3.4*  CL 105 104 103  CO2 21* 23 22  BUN 10 10 10   CREATININE 0.94 0.74 0.87  CALCIUM 8.4* 8.5* 8.5*  GLUCOSE 234* 162* 207*      Ambre Kobayashi Mayra ReelZahra Tarique Loveall, MD 07/20/2016, 11:59 AM PGY-2, [facility] FPTS Intern pager: [phone], text pages welcome

## 2016-07-20 NOTE — Progress Notes (Signed)
Pt had 3 blood sugars below 250 and anion gap of 9, paged MD to ask to start order phase 2 of DKA stabilizer protocol. Did talk with FMTS intern and discussed pt and his blood sugars staying around 220-235 from 11p-2am and MD stated that he would like patient to remain on DKA stabilizer until morning when pt is seen.

## 2016-07-21 LAB — GLUCOSE, CAPILLARY
GLUCOSE-CAPILLARY: 300 mg/dL — AB (ref 65–99)
GLUCOSE-CAPILLARY: 278 mg/dL — AB (ref 65–99)
GLUCOSE-CAPILLARY: 300 mg/dL — AB (ref 65–99)
Glucose-Capillary: 270 mg/dL — ABNORMAL HIGH (ref 65–99)

## 2016-07-21 LAB — BASIC METABOLIC PANEL
Anion gap: 11 (ref 5–15)
BUN: 13 mg/dL (ref 6–20)
CHLORIDE: 103 mmol/L (ref 101–111)
CO2: 21 mmol/L — AB (ref 22–32)
CREATININE: 0.9 mg/dL (ref 0.61–1.24)
Calcium: 8.5 mg/dL — ABNORMAL LOW (ref 8.9–10.3)
GFR calc Af Amer: >60 mL/min (ref >60)
GFR calc non Af Amer: >60 mL/min (ref >60)
Glucose, Bld: 278 mg/dL — ABNORMAL HIGH (ref 65–99)
Potassium: 3.4 mmol/L — ABNORMAL LOW (ref 3.5–5.1)
Sodium: 135 mmol/L (ref 135–145)

## 2016-07-21 LAB — CBC
HEMATOCRIT: 36.8 % — AB (ref 39.0–52.0)
HEMOGLOBIN: 12.6 g/dL — AB (ref 13.0–17.0)
MCH: 28.8 pg (ref 26.0–34.0)
MCHC: 34.2 g/dL (ref 30.0–36.0)
MCV: 84.2 fL (ref 78.0–100.0)
Platelets: 260 10*3/uL (ref 150–400)
RBC: 4.37 MIL/uL (ref 4.22–5.81)
RDW: 12 % (ref 11.5–15.5)
WBC: 11.2 10*3/uL — ABNORMAL HIGH (ref 4.0–10.5)

## 2016-07-21 LAB — HEMOGLOBIN A1C
Hgb A1c MFr Bld: 12 % — ABNORMAL HIGH (ref 4.8–5.6)
Mean Plasma Glucose: 298 mg/dL

## 2016-07-21 LAB — VANCOMYCIN, TROUGH: Vancomycin Tr: 5 ug/mL — ABNORMAL LOW (ref 15–20)

## 2016-07-21 MED ORDER — CLINDAMYCIN PHOSPHATE 600 MG/50ML IV SOLN
600.0000 mg | Freq: Three times a day (TID) | INTRAVENOUS | Status: DC
  Administered 2016-07-21 – 2016-07-24 (×9): 600 mg via INTRAVENOUS
  Filled 2016-07-21 (×9): qty 50

## 2016-07-21 MED ORDER — INSULIN GLARGINE 100 UNIT/ML ~~LOC~~ SOLN
13.0000 [IU] | Freq: Every day | SUBCUTANEOUS | Status: DC
  Administered 2016-07-21: 13 [IU] via SUBCUTANEOUS
  Filled 2016-07-21 (×3): qty 0.13

## 2016-07-21 MED ORDER — VANCOMYCIN HCL IN DEXTROSE 1-5 GM/200ML-% IV SOLN
1000.0000 mg | Freq: Three times a day (TID) | INTRAVENOUS | Status: DC
  Administered 2016-07-21 – 2016-07-23 (×5): 1000 mg via INTRAVENOUS
  Filled 2016-07-21 (×7): qty 200

## 2016-07-21 NOTE — Progress Notes (Signed)
Family Medicine Teaching Service Daily Progress Note Intern Pager: [phone]  Patient name: Jimmy SacksBrian Erby Medical record number: [id number] Date of birth: [birth year] Age: 45 y.o. Gender: male  Primary Care Provider: Pcp Not In System Consultants: None Code Status: Full  Pt Overview and Major Events to Date:  10/15: Surgery Consult   Assessment and Plan:  #Left neck abscess/Cellulitis, improving WBC this morning 11.2  down from 14.8. Mild tachycardia, but rest of vitals are within normal limits. Blood culture show no growth to date. --Continue Vancomycin 1000 mg BID, per pharmacy  --Continue Toradol 30mg  q6 --F/u on blood cultures --F/u on am CBC  --Would consider surgery consult with worsening pain and cellulitis --Continue Zofran 4 mg q 6 prn --ContinueTylenol 650 mg q6 prn --Wound care for abscess  --Follow up on Surgery recs   #DKA, resolved Anion gap this morning is 11, glucose this morning 278, patient on SSI and NS. --Continue SSI -- Increase Lantus to 13 U --CBGs ACQHS --F/u on A1c --Diabetic education --Case management for financial assistant with medication and following up with physician  --Potassium 3.3 this AM - replete with KDUR 40, follow BMET   FEN/GI: Carb modified, NS 125 cc/hr Prophylaxis: SCD as patient allergic to pork products   Disposition: Home  Subjective:  No acute events overnight. Patient still complaining of lot of drainage from abscess, pain is better controlled. Patient has been eating but feel diet is not appropriate for his diagnosis, lot of carbs. Patient would like to order his own food and will do so today.  Objective: Temp:  [98.2 F (36.8 C)-98.8 F (37.1 C)] 98.4 F (36.9 C) (10/15 0456) Pulse Rate:  [86-104] 102 (10/14 2005) Resp:  [18-23] 18 (10/14 2005) BP: (108-126)/(69-84) 113/78 (10/15 0456) SpO2:  [98 %-100 %] 100 % (10/14 2005)   Physical Exam: General: NAD, sitting up in bed on his computer  Cardiovascular: RRR, no  murmurs  Respiratory: CTAB, no signs of acute distress  Abdomen: BS+, no ttp Skin: Abscess with pus drainage from site, surrounding erythema unchanged remains within borders of the marker.   Laboratory:  Recent Labs Lab 07/19/16 1043 07/20/16 0555 07/21/16 0305  WBC 16.8* 14.8* 11.2*  HGB 14.6 13.1 12.6*  HCT 40.6 37.2* 36.8*  PLT 258 241 260    Recent Labs Lab 07/20/16 0555 07/20/16 1011 07/21/16 0305  NA 135 135 135  K 3.3* 3.4* 3.4*  CL 104 103 103  CO2 23 22 21*  BUN 10 10 13   CREATININE 0.74 0.87 0.90  CALCIUM 8.5* 8.5* 8.5*  GLUCOSE 162* 207* 278*   Ct Soft Tissue Neck W Contrast  Result Date: 07/19/2016 CLINICAL DATA:  Draining sole or of the posterior neck. 3-5 day history. EXAM: CT NECK WITH CONTRAST TECHNIQUE: IMPRESSION: Nonspecific soft tissue swelling within the subcutaneous fat at the left posterior neck from the skullbase to the C6 level. Some involvement of the posterior paraspinous musculature on the left. No evidence of well-circumscribed nonenhancing collection to suggest drainable abscess. Findings are consistent with significant cellulitis. Electronically Signed   By: Paulina FusiMark  Shogry M.D.   On: 07/19/2016 11:33    Lovena NeighboursAbdoulaye Kathleena Freeman, MD 07/21/2016, 6:23 AM PGY-1, [place] [facility] FPTS Intern pager: [phone], text pages welcome

## 2016-07-21 NOTE — Progress Notes (Signed)
Pharmacy Antibiotic Note  Jimmy Castillo is a 45 y.o. male admitted on 07/19/2016 with a neck abscess.  Pharmacy has been consulted for Vancomycin dosing.  His initial vancomycin trough is subtherapeutic (goal 10-15).  His renal function appears stable.  Plan: Increase Vancomycin to 1g IV q8h Recheck Vancomycin trough at steady state BMET with AM labs to assess renal function  Height: 5\' 8"  (172.7 cm) Weight: 189 lb 6 oz (85.9 kg) IBW/kg (Calculated) : 68.4  Temp (24hrs), Avg:98.5 F (36.9 C), Min:98.2 F (36.8 C), Max:98.8 F (37.1 C)   Recent Labs Lab 07/19/16 1043  07/19/16 2034 07/19/16 2252 07/20/16 0205 07/20/16 0555 07/20/16 1011 07/21/16 0305 07/21/16 1310  WBC 16.8*  --   --   --   --  14.8*  --  11.2*  --   CREATININE 1.17  < > 1.02 0.94 0.94 0.74 0.87 0.90  --   LATICACIDVEN  --   --  1.1 1.0  --   --   --   --   --   VANCOTROUGH  --   --   --   --   --   --   --   --  5*  < > = values in this interval not displayed.  Estimated Creatinine Clearance: 109.4 mL/min (by C-G formula based on SCr of 0.9 mg/dL).    Allergies  Allergen Reactions  . Codeine Other (See Comments)    hallucinations  . Other Other (See Comments)    Pt allergic to all opiates - says has "weird effects on him"  . Pork-Derived Products     Antimicrobials this admission: Vanc 10/13>> Clinda 10/15>>  Dose adjustments this admission:  10/15 Vanc trough 5 on 1g q12h; goal 10-15 mcg/ml  Microbiology results:  10/13 BCx: ngtd 10/14 MRSA PCR: NEG  Thank you for allowing pharmacy to be a part of this patient's care.  Estella HuskMichelle Jishnu Jenniges, Pharm.D., BCPS, AAHIVP Clinical Pharmacist Phone: [phone] or [phone] 07/21/2016, 2:36 PM

## 2016-07-21 NOTE — Progress Notes (Signed)
CM received call from [facility].  Unfortunately, pt has insurance and [facility]  CM  Has placed [facility] number on AVS to secure a PCP.  No other CM needs were communicated.

## 2016-07-21 NOTE — Consult Note (Signed)
Reason for Consult:Neck abscess Referring Physician: [name] is an 45 y.o. male.  HPI: Jimmy Castillo was in his usual state of health when he began to get swelling and pain in the left posterior neck Thursday, Oct 5. By the weekend it had started to spontaneously drain. He tried hot compresses and soaks but it didn't get any better. He came to the ED Friday because he had had enough. It was I&D'd in the ED and he was placed on IV vanc. Surgery was consulted as it continues to drain copiously. The patient notes a marked improvement since last night as he can now turn his head to the left and raise his left arm. He has noted some greenish tinge and odor to the discharge.  Past Medical History:  Diagnosis Date  . Diabetes mellitus without complication Jimmy Castillo)     Past Surgical History:  Procedure Laterality Date  . WISDOM TOOTH EXTRACTION      Family History  Problem Relation Age of Onset  . Lung disease Father   . Stroke Father     Social History:  reports that he has never smoked. He has never used smokeless tobacco. He reports that he drinks alcohol. He reports that he does not use drugs.  Allergies:  Allergies  Allergen Reactions  . Codeine Other (See Comments)    hallucinations  . Other Other (See Comments)    Pt allergic to all opiates - says has "weird effects on him"  . Pork-Derived Products     Medications: I have reviewed the patient's current medications.  Results for orders placed or performed during the hospital encounter of 07/19/16 (from the past 48 hour(s))  Urinalysis, Routine w reflex microscopic (not at [facility])     Status: Abnormal   Collection Time: 07/19/16  3:32 PM  Result Value Ref Range   Color, Urine AMBER (A) YELLOW    Comment: BIOCHEMICALS MAY BE AFFECTED BY COLOR   APPearance CLEAR CLEAR   Specific Gravity, Urine 1.020 1.005 - 1.030   pH 5.5 5.0 - 8.0   Glucose, UA >1000 (A) NEGATIVE mg/dL   Hgb urine dipstick NEGATIVE NEGATIVE   Bilirubin  Urine SMALL (A) NEGATIVE   Ketones, ur >80 (A) NEGATIVE mg/dL   Protein, ur 30 (A) NEGATIVE mg/dL   Nitrite NEGATIVE NEGATIVE   Leukocytes, UA NEGATIVE NEGATIVE  Urine microscopic-add on     Status: Abnormal   Collection Time: 07/19/16  3:32 PM  Result Value Ref Range   Squamous Epithelial / LPF 0-5 (A) NONE SEEN   WBC, UA 0-5 0 - 5 WBC/hpf   RBC / HPF NONE SEEN 0 - 5 RBC/hpf   Bacteria, UA RARE (A) NONE SEEN   Casts GRANULAR CAST (A) NEGATIVE  POC CBG, ED     Status: Abnormal   Collection Time: 07/19/16  3:36 PM  Result Value Ref Range   Glucose-Capillary 216 (H) 65 - 99 mg/dL  I-Stat venous blood gas, ED     Status: Abnormal   Collection Time: 07/19/16  4:59 PM  Result Value Ref Range   pH, Ven 7.280 7.250 - 7.430   pCO2, Ven 40.0 (L) 44.0 - 60.0 mmHg   pO2, Ven 20.0 (LL) 32.0 - 45.0 mmHg   Bicarbonate 18.8 (L) 20.0 - 28.0 mmol/L   TCO2 20 0 - 100 mmol/L   O2 Saturation 27.0 %   Acid-base deficit 7.0 (H) 0.0 - 2.0 mmol/L   Patient temperature HIDE    Sample  type VENOUS    Comment NOTIFIED PHYSICIAN   Basic metabolic panel     Status: Abnormal   Collection Time: 07/19/16  5:06 PM  Result Value Ref Range   Sodium 135 135 - 145 mmol/L   Potassium 3.3 (L) 3.5 - 5.1 mmol/L   Chloride 99 (L) 101 - 111 mmol/L   CO2 21 (L) 22 - 32 mmol/L   Glucose, Bld 254 (H) 65 - 99 mg/dL   BUN 12 6 - 20 mg/dL   Creatinine, Ser 1.10 0.61 - 1.24 mg/dL   Calcium 8.9 8.9 - 10.3 mg/dL   GFR calc non Af Amer >60 >60 mL/min   GFR calc Af Amer >60 >60 mL/min    Comment: (NOTE) The eGFR has been calculated using the CKD EPI equation. This calculation has not been validated in all clinical situations. eGFR's persistently <60 mL/min signify possible Chronic Kidney Disease.    Anion gap 15 5 - 15  CBG monitoring, ED     Status: Abnormal   Collection Time: 07/19/16  5:51 PM  Result Value Ref Range   Glucose-Capillary 246 (H) 65 - 99 mg/dL  CBG monitoring, ED     Status: Abnormal   Collection  Time: 07/19/16  7:44 PM  Result Value Ref Range   Glucose-Capillary 256 (H) 65 - 99 mg/dL  Culture, blood (routine x 2)     Status: None (Preliminary result)   Collection Time: 07/19/16  8:22 PM  Result Value Ref Range   Specimen Description BLOOD RIGHT HAND    Special Requests BOTTLES DRAWN AEROBIC AND ANAEROBIC 5CC    Culture NO GROWTH < 24 HOURS    Report Status PENDING   Culture, blood (routine x 2)     Status: None (Preliminary result)   Collection Time: 07/19/16  8:32 PM  Result Value Ref Range   Specimen Description BLOOD LEFT HAND    Special Requests BOTTLES DRAWN AEROBIC AND ANAEROBIC 5CC    Culture NO GROWTH < 24 HOURS    Report Status PENDING   Basic metabolic panel     Status: Abnormal   Collection Time: 07/19/16  8:34 PM  Result Value Ref Range   Sodium 133 (L) 135 - 145 mmol/L   Potassium 3.3 (L) 3.5 - 5.1 mmol/L   Chloride 101 101 - 111 mmol/L   CO2 18 (L) 22 - 32 mmol/L   Glucose, Bld 259 (H) 65 - 99 mg/dL   BUN 13 6 - 20 mg/dL   Creatinine, Ser 1.02 0.61 - 1.24 mg/dL   Calcium 8.7 (L) 8.9 - 10.3 mg/dL   GFR calc non Af Amer >60 >60 mL/min   GFR calc Af Amer >60 >60 mL/min    Comment: (NOTE) The eGFR has been calculated using the CKD EPI equation. This calculation has not been validated in all clinical situations. eGFR's persistently <60 mL/min signify possible Chronic Kidney Disease.    Anion gap 14 5 - 15  Lactic acid, plasma     Status: None   Collection Time: 07/19/16  8:34 PM  Result Value Ref Range   Lactic Acid, Venous 1.1 0.5 - 1.9 mmol/L  CBG monitoring, ED     Status: Abnormal   Collection Time: 07/19/16  9:03 PM  Result Value Ref Range   Glucose-Capillary 214 (H) 65 - 99 mg/dL  Glucose, capillary     Status: Abnormal   Collection Time: 07/19/16 10:29 PM  Result Value Ref Range   Glucose-Capillary 223 (H) 65 -  99 mg/dL   Comment 1 Document in Chart   Basic metabolic panel     Status: Abnormal   Collection Time: 07/19/16 10:52 PM  Result  Value Ref Range   Sodium 135 135 - 145 mmol/L   Potassium 3.3 (L) 3.5 - 5.1 mmol/L   Chloride 104 101 - 111 mmol/L   CO2 22 22 - 32 mmol/L   Glucose, Bld 233 (H) 65 - 99 mg/dL   BUN 10 6 - 20 mg/dL   Creatinine, Ser 0.94 0.61 - 1.24 mg/dL   Calcium 8.4 (L) 8.9 - 10.3 mg/dL   GFR calc non Af Amer >60 >60 mL/min   GFR calc Af Amer >60 >60 mL/min    Comment: (NOTE) The eGFR has been calculated using the CKD EPI equation. This calculation has not been validated in all clinical situations. eGFR's persistently <60 mL/min signify possible Chronic Kidney Disease.    Anion gap 9 5 - 15  Lactic acid, plasma     Status: None   Collection Time: 07/19/16 10:52 PM  Result Value Ref Range   Lactic Acid, Venous 1.0 0.5 - 1.9 mmol/L  Glucose, capillary     Status: Abnormal   Collection Time: 07/19/16 11:58 PM  Result Value Ref Range   Glucose-Capillary 191 (H) 65 - 99 mg/dL  Glucose, capillary     Status: Abnormal   Collection Time: 07/20/16  1:11 AM  Result Value Ref Range   Glucose-Capillary 227 (H) 65 - 99 mg/dL  Glucose, capillary     Status: Abnormal   Collection Time: 07/20/16  1:59 AM  Result Value Ref Range   Glucose-Capillary 223 (H) 65 - 99 mg/dL  Basic metabolic panel     Status: Abnormal   Collection Time: 07/20/16  2:05 AM  Result Value Ref Range   Sodium 135 135 - 145 mmol/L   Potassium 3.5 3.5 - 5.1 mmol/L   Chloride 105 101 - 111 mmol/L   CO2 21 (L) 22 - 32 mmol/L   Glucose, Bld 234 (H) 65 - 99 mg/dL   BUN 10 6 - 20 mg/dL   Creatinine, Ser 0.94 0.61 - 1.24 mg/dL   Calcium 8.4 (L) 8.9 - 10.3 mg/dL   GFR calc non Af Amer >60 >60 mL/min   GFR calc Af Amer >60 >60 mL/min    Comment: (NOTE) The eGFR has been calculated using the CKD EPI equation. This calculation has not been validated in all clinical situations. eGFR's persistently <60 mL/min signify possible Chronic Kidney Disease.    Anion gap 9 5 - 15  Glucose, capillary     Status: Abnormal   Collection Time:  07/20/16  3:05 AM  Result Value Ref Range   Glucose-Capillary 235 (H) 65 - 99 mg/dL  Glucose, capillary     Status: Abnormal   Collection Time: 07/20/16  4:11 AM  Result Value Ref Range   Glucose-Capillary 206 (H) 65 - 99 mg/dL  Glucose, capillary     Status: Abnormal   Collection Time: 07/20/16  4:58 AM  Result Value Ref Range   Glucose-Capillary 174 (H) 65 - 99 mg/dL  Basic metabolic panel     Status: Abnormal   Collection Time: 07/20/16  5:55 AM  Result Value Ref Range   Sodium 135 135 - 145 mmol/L   Potassium 3.3 (L) 3.5 - 5.1 mmol/L   Chloride 104 101 - 111 mmol/L   CO2 23 22 - 32 mmol/L   Glucose, Bld 162 (H) 65 -  99 mg/dL   BUN 10 6 - 20 mg/dL   Creatinine, Ser 0.74 0.61 - 1.24 mg/dL   Calcium 8.5 (L) 8.9 - 10.3 mg/dL   GFR calc non Af Amer >60 >60 mL/min   GFR calc Af Amer >60 >60 mL/min    Comment: (NOTE) The eGFR has been calculated using the CKD EPI equation. This calculation has not been validated in all clinical situations. eGFR's persistently <60 mL/min signify possible Chronic Kidney Disease.    Anion gap 8 5 - 15  CBC     Status: Abnormal   Collection Time: 07/20/16  5:55 AM  Result Value Ref Range   WBC 14.8 (H) 4.0 - 10.5 Jimmy/uL   RBC 4.48 4.22 - 5.81 MIL/uL   Hemoglobin 13.1 13.0 - 17.0 g/dL   HCT 37.2 (L) 39.0 - 52.0 %   MCV 83.0 78.0 - 100.0 fL   MCH 29.2 26.0 - 34.0 pg   MCHC 35.2 30.0 - 36.0 g/dL   RDW 11.9 11.5 - 15.5 %   Platelets 241 150 - 400 Jimmy/uL  Glucose, capillary     Status: Abnormal   Collection Time: 07/20/16  6:04 AM  Result Value Ref Range   Glucose-Capillary 154 (H) 65 - 99 mg/dL  Glucose, capillary     Status: Abnormal   Collection Time: 07/20/16  7:13 AM  Result Value Ref Range   Glucose-Capillary 147 (H) 65 - 99 mg/dL  MRSA PCR Screening     Status: None   Collection Time: 07/20/16  8:04 AM  Result Value Ref Range   MRSA by PCR NEGATIVE NEGATIVE    Comment:        The GeneXpert MRSA Assay (FDA approved for NASAL  specimens only), is one component of a comprehensive MRSA colonization surveillance program. It is not intended to diagnose MRSA infection nor to guide or monitor treatment for MRSA infections.   Glucose, capillary     Status: Abnormal   Collection Time: 07/20/16  8:23 AM  Result Value Ref Range   Glucose-Capillary 110 (H) 65 - 99 mg/dL  Basic metabolic panel     Status: Abnormal   Collection Time: 07/20/16 10:11 AM  Result Value Ref Range   Sodium 135 135 - 145 mmol/L   Potassium 3.4 (L) 3.5 - 5.1 mmol/L   Chloride 103 101 - 111 mmol/L   CO2 22 22 - 32 mmol/L   Glucose, Bld 207 (H) 65 - 99 mg/dL   BUN 10 6 - 20 mg/dL   Creatinine, Ser 0.87 0.61 - 1.24 mg/dL   Calcium 8.5 (L) 8.9 - 10.3 mg/dL   GFR calc non Af Amer >60 >60 mL/min   GFR calc Af Amer >60 >60 mL/min    Comment: (NOTE) The eGFR has been calculated using the CKD EPI equation. This calculation has not been validated in all clinical situations. eGFR's persistently <60 mL/min signify possible Chronic Kidney Disease.    Anion gap 10 5 - 15  Glucose, capillary     Status: Abnormal   Collection Time: 07/20/16 12:35 PM  Result Value Ref Range   Glucose-Capillary 241 (H) 65 - 99 mg/dL  Glucose, capillary     Status: Abnormal   Collection Time: 07/20/16  4:59 PM  Result Value Ref Range   Glucose-Capillary 321 (H) 65 - 99 mg/dL  Glucose, capillary     Status: Abnormal   Collection Time: 07/20/16  9:35 PM  Result Value Ref Range   Glucose-Capillary 262 (H) 65 -  99 mg/dL  CBC     Status: Abnormal   Collection Time: 07/21/16  3:05 AM  Result Value Ref Range   WBC 11.2 (H) 4.0 - 10.5 Jimmy/uL   RBC 4.37 4.22 - 5.81 MIL/uL   Hemoglobin 12.6 (L) 13.0 - 17.0 g/dL   HCT 36.8 (L) 39.0 - 52.0 %   MCV 84.2 78.0 - 100.0 fL   MCH 28.8 26.0 - 34.0 pg   MCHC 34.2 30.0 - 36.0 g/dL   RDW 12.0 11.5 - 15.5 %   Platelets 260 150 - 400 Jimmy/uL  Basic metabolic panel     Status: Abnormal   Collection Time: 07/21/16  3:05 AM  Result  Value Ref Range   Sodium 135 135 - 145 mmol/L   Potassium 3.4 (L) 3.5 - 5.1 mmol/L   Chloride 103 101 - 111 mmol/L   CO2 21 (L) 22 - 32 mmol/L   Glucose, Bld 278 (H) 65 - 99 mg/dL   BUN 13 6 - 20 mg/dL   Creatinine, Ser 0.90 0.61 - 1.24 mg/dL   Calcium 8.5 (L) 8.9 - 10.3 mg/dL   GFR calc non Af Amer >60 >60 mL/min   GFR calc Af Amer >60 >60 mL/min    Comment: (NOTE) The eGFR has been calculated using the CKD EPI equation. This calculation has not been validated in all clinical situations. eGFR's persistently <60 mL/min signify possible Chronic Kidney Disease.    Anion gap 11 5 - 15  Glucose, capillary     Status: Abnormal   Collection Time: 07/21/16  9:58 AM  Result Value Ref Range   Glucose-Capillary 300 (H) 65 - 99 mg/dL    Ct Soft Tissue Neck W Contrast  Result Date: 07/19/2016 CLINICAL DATA:  Draining sole or of the posterior neck. 3-5 day history. EXAM: CT NECK WITH CONTRAST TECHNIQUE: Multidetector CT imaging of the neck was performed using the standard protocol following the bolus administration of intravenous contrast. CONTRAST:  75 cc Isovue-300 COMPARISON:  None. FINDINGS: Pharynx and larynx: No mucosal or submucosal lesion. Salivary glands: Submandibular and parotid glands are normal. Thyroid: Normal. Lymph nodes: Mild reactive nodal prominence bilaterally, left more than right. No to dominant enlarged or low-density node. Vascular: Normal Limited intracranial: Normal Visualized orbits: Normal Mastoids and visualized paranasal sinuses: Normal Skeleton: No significant finding. Probable hemangioma within the C6 vertebral body. Upper chest: Normal Other: There is nonspecific soft tissue swelling within the subcutaneous fat of the left posterior neck from the skullbase to the C6 level consistent with nonspecific cellulitis. Some inflammation affects the paraspinous musculature on the left. No evidence of nonenhancing low-density collection to suggest drainable abscess. IMPRESSION:  Nonspecific soft tissue swelling within the subcutaneous fat at the left posterior neck from the skullbase to the C6 level. Some involvement of the posterior paraspinous musculature on the left. No evidence of well-circumscribed nonenhancing collection to suggest drainable abscess. Findings are consistent with significant cellulitis. Electronically Signed   By: Nelson Chimes M.D.   On: 07/19/2016 11:33    Review of Systems  Constitutional: Negative for chills and fever.  Eyes: Negative for blurred vision.  Respiratory: Negative for cough.   Cardiovascular: Negative for chest pain.  Gastrointestinal: Negative for nausea and vomiting.  Genitourinary: Negative for dysuria.  Musculoskeletal: Positive for neck pain.  Skin: Negative for rash.  Neurological: Negative for dizziness and headaches.  Psychiatric/Behavioral: Negative for substance abuse.   Blood pressure 123/86, pulse 95, temperature 98.2 F (36.8 C), temperature source Oral, resp.  rate (!) 24, height 5' 8"  (1.727 m), weight 85.9 kg (189 lb 6 oz), SpO2 98 %. Physical Exam  Constitutional: He appears well-developed and well-nourished. No distress.  HENT:  Head:    Lymphadenopathy:    He has no cervical adenopathy.  Skin: He is not diaphoretic.    Assessment/Plan: Neck abscess -- I have sent fluid for culture. I will add Clindamycin to regimen. I do not think further drainage is indicated at present with no obvious fluctuance and clinical improvement. Recommended aggressive glucose control. We will follow with you.  Thank you for this consult.    Lisette Abu, PA-C Pager: [phone] 07/21/2016, 11:03 AM

## 2016-07-22 DIAGNOSIS — L0211 Cutaneous abscess of neck: Secondary | ICD-10-CM

## 2016-07-22 LAB — BASIC METABOLIC PANEL
Anion gap: 9 (ref 5–15)
BUN: 14 mg/dL (ref 6–20)
CALCIUM: 8.3 mg/dL — AB (ref 8.9–10.3)
CO2: 26 mmol/L (ref 22–32)
CREATININE: 0.81 mg/dL (ref 0.61–1.24)
Chloride: 102 mmol/L (ref 101–111)
GFR calc non Af Amer: >60 mL/min (ref >60)
GLUCOSE: 259 mg/dL — AB (ref 65–99)
Potassium: 3.2 mmol/L — ABNORMAL LOW (ref 3.5–5.1)
Sodium: 137 mmol/L (ref 135–145)

## 2016-07-22 LAB — GLUCOSE, CAPILLARY
GLUCOSE-CAPILLARY: 267 mg/dL — AB (ref 65–99)
Glucose-Capillary: 237 mg/dL — ABNORMAL HIGH (ref 65–99)
Glucose-Capillary: 233 mg/dL — ABNORMAL HIGH (ref 65–99)
Glucose-Capillary: 172 mg/dL — ABNORMAL HIGH (ref 65–99)

## 2016-07-22 LAB — CBC
HCT: 35.2 % — ABNORMAL LOW (ref 39.0–52.0)
Hemoglobin: 11.9 g/dL — ABNORMAL LOW (ref 13.0–17.0)
MCH: 28.7 pg (ref 26.0–34.0)
MCHC: 33.8 g/dL (ref 30.0–36.0)
MCV: 84.8 fL (ref 78.0–100.0)
PLATELETS: 252 10*3/uL (ref 150–400)
RBC: 4.15 MIL/uL — ABNORMAL LOW (ref 4.22–5.81)
RDW: 12 % (ref 11.5–15.5)
WBC: 7.8 10*3/uL (ref 4.0–10.5)

## 2016-07-22 MED ORDER — INSULIN STARTER KIT- PEN NEEDLES (ENGLISH)
1.0000 | Freq: Once | Status: AC
  Administered 2016-07-22: 1
  Filled 2016-07-22: qty 1

## 2016-07-22 MED ORDER — INSULIN GLARGINE 100 UNIT/ML ~~LOC~~ SOLN
16.0000 [IU] | Freq: Every day | SUBCUTANEOUS | Status: DC
  Administered 2016-07-22: 16 [IU] via SUBCUTANEOUS
  Filled 2016-07-22 (×2): qty 0.16

## 2016-07-22 MED ORDER — POTASSIUM CHLORIDE CRYS ER 20 MEQ PO TBCR
20.0000 meq | EXTENDED_RELEASE_TABLET | Freq: Two times a day (BID) | ORAL | Status: DC
  Administered 2016-07-22 – 2016-07-24 (×5): 20 meq via ORAL
  Filled 2016-07-22 (×5): qty 1

## 2016-07-22 MED ORDER — LIVING WELL WITH DIABETES BOOK
Freq: Once | Status: AC
  Administered 2016-07-22: 14:00:00
  Filled 2016-07-22: qty 1

## 2016-07-22 NOTE — Progress Notes (Signed)
[facility] Office:  [phone] General Surgery Progress Note   LOS: 3 days  POD -     Assessment/Plan: 1.  Left posterior neck abscess  [repeat photo]  Vanc and Clindamycin  The surrounding cellulitis appears better - but not sure that he is out of the woods.  Will get in shower today and continue dressing changes.  If no better tomorrow, may need to go to OR for further debridement.  2.  DM  Newly diagnosed  Glucose - 267 this AM  Need good control of DM to assist in wound healing. 3.  Chronic disability 4.  Needs to ambulate more  He says that he has had very little activity since admission   Active Problems:   Abscess   Cellulitis   Diabetic ketoacidosis without coma associated with type 2 diabetes mellitus (HCC)  Subjective:  Can turn neck now.  He thinks that it is better.  Married.  Has 47 yo daughter.  Objective:   Vitals:   07/22/16 0428 07/22/16 0835  BP: 120/85   Pulse:  72  Resp:  12  Temp: 98.4 F (36.9 C) 98.2 F (36.8 C)     Intake/Output from previous day:  10/15 0701 - 10/16 0700 In: 853 [P.O.:600; I.V.:3; IV Piggyback:250] Out: 450 [Urine:450]  Intake/Output this shift:  No intake/output data recorded.   Physical Exam:   General: WN WM who is alert and oriented.    HEENT: Normal. Pupils equal. .   Wound: 3 cm incision with purulence.  Surrounding cellulitis better.     Lab Results:    Recent Labs  07/20/16 0555 07/21/16 0305  WBC 14.8* 11.2*  HGB 13.1 12.6*  HCT 37.2* 36.8*  PLT 241 260    BMET   Recent Labs  07/21/16 0305 07/22/16 0358  NA 135 137  K 3.4* 3.2*  CL 103 102  CO2 21* 26  GLUCOSE 278* 259*  BUN 13 14  CREATININE 0.90 0.81  CALCIUM 8.5* 8.3*    PT/INR  No results for input(s): LABPROT, INR in the last 72 hours.  ABG   Recent Labs  07/19/16 1659  HCO3 18.8*     Studies/Results:  No results found.   Anti-infectives:   Anti-infectives    Start     Dose/Rate Route Frequency  Ordered Stop   07/21/16 2200  vancomycin (VANCOCIN) IVPB 1000 mg/200 mL premix     1,000 mg 200 mL/hr over 60 Minutes Intravenous Every 8 hours 07/21/16 1434     07/21/16 1400  clindamycin (CLEOCIN) IVPB 600 mg     600 mg 100 mL/hr over 30 Minutes Intravenous Every 8 hours 07/21/16 1129     07/20/16 0100  vancomycin (VANCOCIN) IVPB 1000 mg/200 mL premix  Status:  Discontinued     1,000 mg 200 mL/hr over 60 Minutes Intravenous Every 12 hours 07/19/16 1227 07/21/16 1434   07/19/16 1230  vancomycin (VANCOCIN) 1,500 mg in sodium chloride 0.9 % 500 mL IVPB     1,500 mg 250 mL/hr over 120 Minutes Intravenous  Once 07/19/16 1216 07/19/16 1515   07/19/16 1215  vancomycin (VANCOCIN) IVPB 1000 mg/200 mL premix  Status:  Discontinued     1,000 mg 200 mL/hr over 60 Minutes Intravenous  Once 07/19/16 1214 07/19/16 1216      Ovidio Kinavid Elizet Kaplan, MD, FACS Pager: [phone] [facility] Office: [phone] 07/22/2016

## 2016-07-22 NOTE — Care Management Note (Signed)
Case Management Note  Patient Details  Name: Jimmy Castillo MRN: [id number] Date of Birth: Feb 17, 1971  Subjective/Objective:    Pt has no PCP, is interesteBrendia Sacksd in [facility] - CM provided brochures for both.  Pt wants to discuss with wife and will provide decision to CM tomorrow morning. Pt also has no prescription drug plan, provided information on SHIIP or enrolling through [email or web address].                 Expected Discharge Plan:  Home/Self Care  Discharge planning Services  CM Consult  Status of Service:  In process, will continue to follow  Magdalene RiverMayo, Katy Brickell T, RN 07/22/2016, 2:40 PM

## 2016-07-22 NOTE — Progress Notes (Addendum)
Inpatient Diabetes Program Recommendations  AACE/ADA: New Consensus Statement on Inpatient Glycemic Control (2015)  Target Ranges:  Prepandial:   less than 140 mg/dL      Peak postprandial:   less than 180 mg/dL (1-2 hours)      Critically ill patients:  140 - 180 mg/dL   Results for GERHARDT, GLEED (MRN [id number]) as of 07/22/2016 09:28  Ref. Range 07/19/2016 20:34  Hemoglobin A1C Latest Ref Range: 4.8 - 5.6 % 12.0 (H)  Results for ESSA, MALACHI (MRN [id number]) as of 07/22/2016 09:28  Ref. Range 07/21/2016 09:58 07/21/2016 13:13 07/21/2016 16:38 07/21/2016 21:08 07/22/2016 08:41  Glucose-Capillary Latest Ref Range: 65 - 99 mg/dL 300 (H) 278 (H) 300 (H) 270 (H) 267 (H)    Review of Glycemic Control  Diabetes history: DM2 Outpatient Diabetes medications: None Current orders for Inpatient glycemic control: Novolog correction 0-15 units TIDAC; Lantus 16 units daily  Inpatient Diabetes Program Recommendations: Please consider:  Increasing Lantus to 18 units daily to lower fasting CBG's;  Adding Novolog correction 0-5 units QHS  If postprandial CBG's remain high and patient eats >50% of meals, may want to add Novolog 3 units TIDAC.  Note: Diabetes Coordinator Consultation Assessment:  Ineffective self-management of diabetes  Goals to be met by discharge: 1.  Patient will identify plan for self-management of Diabetes care management needs. 2.  Patient will schedule a follow up appoint with a new PCP  Interventions: 1.  Teach and reinforce the following (teach back and/or return demonstration):      When to call MD      Sick day rules      Hypo/Hyperglycemia      Medications at D/C (what these are, why taking, when taking, how taking, common           S.E.'s)      CBG monitoring      How/why to check feet every day      Why exercise is important      Carb modified diet  2.  Identify barriers and facilitators to self-management goals:      No current insurance - patient may need  to be switched over to lower cost insulin while inpatient (NPH or Novolog 70/30 mix).      Lack of knowledge of diabetes management      Willingness to go to outpatient diabetes education and support  3.  Support systems:      Wife and child  Thank you,  Windy Carina, RN, BSN Diabetes Coordinator Inpatient Diabetes Program [phone] (Team Pager) [phone] (AP office) [phone] [facility] office) [phone] [facility] office)

## 2016-07-22 NOTE — Consult Note (Signed)
WOC consult requested prior to surgical team involvement.  They are now following for assessment and plan of care.  Please refer to them for further plan of care. Please re-consult if further assistance is needed.  Thank-you,  Cammie Mcgeeawn Jetta Murray MSN, RN, CWOCN, LimaWCN-AP, CNS [phone]

## 2016-07-22 NOTE — Progress Notes (Signed)
Family Medicine Teaching Service Daily Progress Note Intern Pager: [phone]  Patient name: Jimmy Castillo Medical record number: [id number] Date of birth: [birth year] Age: 45 y.o. Gender: male  Primary Care Provider: Pcp Not In System Consultants: None Code Status: Full  Pt Overview and Major Events to Date:  10/15, 10/16: Surgery Consult   Assessment and Plan:  #Left neck abscess/Cellulitis, improving WBC this morning is from11.2. Patient was seen by surgery yesterday who did not feel the need for I&D. Clindamycin was added to regimen by surgery. Vanc through was low, and frequency was changed to q8 from BID. Seen by surgery this morning if no improvement by tomorrow, patient may need to go to the OR for I&D. --Continue Vancomycin 1000 mg q8, per pharmacy --Continue Clindamycin 600 mg q 8 --Continue Toradol 30mg  q6 --F/u on blood cultures --F/u on am CBC  --Continue Zofran 4 mg q 6 prn --ContinueTylenol 650 mg q6 prn --Wound care for abscess  --PT  #DMII, uncontrolled Blood glucose level continue to be elevated. A1c is 12.0. Patient will most likely need to be started on insulin prior to discharge.  --Continue SSI -- Increase Lantus to 16 U --CBGs ACQHS --Diabetic education --Case management for financial assistant with medication and following up with physician  --KDur 20mg  BID, K 3.2    FEN/GI: Carb modified, NS 125 cc/hr Prophylaxis: SCD as patient allergic to pork products   Disposition: Will continue to manage hyperglycemia and continue antibiotic regimen for cellulitis  Subjective:  No acute events overnight. Patient did well overnight, pain is better this morning. Patient has better range of motion in his neck and is tolerating po well.   Objective: Temp:  [98.2 F (36.8 C)-98.9 F (37.2 C)] (P) 98.4 F (36.9 C) (10/16 0428) Pulse Rate:  [81-100] 81 (10/16 0008) Resp:  [13-28] 17 (10/16 0008) BP: (98-124)/(69-99) (P) 120/85 (10/16 0428) SpO2:  [95 %-99 %] 99 %  (10/16 0008)   Physical Exam: General: NAD, sitting up in bed on his computer  Cardiovascular: RRR, no murmurs  Respiratory: CTAB, no signs of acute distress  Abdomen: BS+, no ttp Skin: Abscess with pus drainage from site, surrounding erythema unchanged remains within borders of the marker.   Laboratory:  Recent Labs Lab 07/19/16 1043 07/20/16 0555 07/21/16 0305  WBC 16.8* 14.8* 11.2*  HGB 14.6 13.1 12.6*  HCT 40.6 37.2* 36.8*  PLT 258 241 260    Recent Labs Lab 07/20/16 1011 07/21/16 0305 07/22/16 0358  NA 135 135 137  K 3.4* 3.4* 3.2*  CL 103 103 102  CO2 22 21* 26  BUN 10 13 14   CREATININE 0.87 0.90 0.81  CALCIUM 8.5* 8.5* 8.3*  GLUCOSE 207* 278* 259*   Ct Soft Tissue Neck W Contrast  Result Date: 07/19/2016 CLINICAL DATA:  Draining sole or of the posterior neck. 3-5 day history. EXAM: CT NECK WITH CONTRAST TECHNIQUE: IMPRESSION: Nonspecific soft tissue swelling within the subcutaneous fat at the left posterior neck from the skullbase to the C6 level. Some involvement of the posterior paraspinous musculature on the left. No evidence of well-circumscribed nonenhancing collection to suggest drainable abscess. Findings are consistent with significant cellulitis. Electronically Signed   By: Paulina FusiMark  Shogry M.D.   On: 07/19/2016 11:33    Lovena NeighboursAbdoulaye Kilo Eshelman, MD 07/22/2016, 6:24 AM PGY-1, [facility] FPTS Intern pager: [phone], text pages welcome

## 2016-07-23 ENCOUNTER — Encounter (HOSPITAL_COMMUNITY)
Admission: EM | Disposition: A | Discharge status: Home / Self Care | Payer: Self-pay | Source: Home / Self Care | Attending: Family Medicine

## 2016-07-23 ENCOUNTER — Inpatient Hospital Stay (HOSPITAL_COMMUNITY): Payer: Medicare Other | Admitting: Anesthesiology

## 2016-07-23 ENCOUNTER — Encounter (HOSPITAL_COMMUNITY): Payer: Self-pay | Admitting: Anesthesiology

## 2016-07-23 HISTORY — PX: INCISION AND DRAINAGE OF WOUND: SHX1803

## 2016-07-23 LAB — GLUCOSE, CAPILLARY
GLUCOSE-CAPILLARY: 226 mg/dL — AB (ref 65–99)
GLUCOSE-CAPILLARY: 188 mg/dL — AB (ref 65–99)
Glucose-Capillary: 249 mg/dL — ABNORMAL HIGH (ref 65–99)
Glucose-Capillary: 157 mg/dL — ABNORMAL HIGH (ref 65–99)

## 2016-07-23 LAB — BASIC METABOLIC PANEL
Anion gap: 11 (ref 5–15)
BUN: 12 mg/dL (ref 6–20)
CHLORIDE: 102 mmol/L (ref 101–111)
CO2: 23 mmol/L (ref 22–32)
CREATININE: 0.84 mg/dL (ref 0.61–1.24)
Calcium: 8.5 mg/dL — ABNORMAL LOW (ref 8.9–10.3)
GFR calc non Af Amer: >60 mL/min (ref >60)
GLUCOSE: 259 mg/dL — AB (ref 65–99)
Potassium: 3.8 mmol/L (ref 3.5–5.1)
Sodium: 136 mmol/L (ref 135–145)

## 2016-07-23 LAB — CBC
HEMATOCRIT: 34 % — AB (ref 39.0–52.0)
HEMOGLOBIN: 12 g/dL — AB (ref 13.0–17.0)
MCH: 29.7 pg (ref 26.0–34.0)
MCHC: 35.3 g/dL (ref 30.0–36.0)
MCV: 84.2 fL (ref 78.0–100.0)
Platelets: 260 10*3/uL (ref 150–400)
RBC: 4.04 MIL/uL — ABNORMAL LOW (ref 4.22–5.81)
RDW: 11.9 % (ref 11.5–15.5)
WBC: 6.6 10*3/uL (ref 4.0–10.5)

## 2016-07-23 LAB — AEROBIC CULTURE  (SUPERFICIAL SPECIMEN)

## 2016-07-23 LAB — VANCOMYCIN, TROUGH: Vancomycin Tr: 19 ug/mL (ref 15–20)

## 2016-07-23 LAB — AEROBIC CULTURE W GRAM STAIN (SUPERFICIAL SPECIMEN)

## 2016-07-23 SURGERY — IRRIGATION AND DEBRIDEMENT WOUND
Anesthesia: General | Site: Neck | Laterality: Left

## 2016-07-23 MED ORDER — LIDOCAINE HCL (CARDIAC) 20 MG/ML IV SOLN
INTRAVENOUS | Status: DC | PRN
  Administered 2016-07-23: 70 mg via INTRAVENOUS

## 2016-07-23 MED ORDER — SODIUM BICARBONATE 4 % IV SOLN
INTRAVENOUS | Status: AC
  Filled 2016-07-23: qty 5

## 2016-07-23 MED ORDER — SUCCINYLCHOLINE CHLORIDE 20 MG/ML IJ SOLN
INTRAMUSCULAR | Status: DC | PRN
  Administered 2016-07-23: 100 mg via INTRAVENOUS

## 2016-07-23 MED ORDER — PROPOFOL 10 MG/ML IV BOLUS
INTRAVENOUS | Status: DC | PRN
  Administered 2016-07-23: 150 mg via INTRAVENOUS

## 2016-07-23 MED ORDER — SUCCINYLCHOLINE CHLORIDE 200 MG/10ML IV SOSY
PREFILLED_SYRINGE | INTRAVENOUS | Status: AC
  Filled 2016-07-23: qty 10

## 2016-07-23 MED ORDER — LIDOCAINE 2% (20 MG/ML) 5 ML SYRINGE
INTRAMUSCULAR | Status: AC
  Filled 2016-07-23: qty 5

## 2016-07-23 MED ORDER — FENTANYL CITRATE (PF) 100 MCG/2ML IJ SOLN
INTRAMUSCULAR | Status: DC | PRN
  Administered 2016-07-23 (×2): 50 ug via INTRAVENOUS
  Administered 2016-07-23: 100 ug via INTRAVENOUS

## 2016-07-23 MED ORDER — HYDROCODONE-ACETAMINOPHEN 5-325 MG PO TABS
1.0000 | ORAL_TABLET | ORAL | Status: DC | PRN
  Administered 2016-07-23: 2 via ORAL
  Administered 2016-07-24: 1 via ORAL
  Filled 2016-07-23: qty 1
  Filled 2016-07-23: qty 2

## 2016-07-23 MED ORDER — FENTANYL CITRATE (PF) 100 MCG/2ML IJ SOLN
INTRAMUSCULAR | Status: AC
  Administered 2016-07-23: 13:00:00
  Filled 2016-07-23: qty 2

## 2016-07-23 MED ORDER — LACTATED RINGERS IV SOLN
INTRAVENOUS | Status: DC | PRN
  Administered 2016-07-23: 11:00:00 via INTRAVENOUS

## 2016-07-23 MED ORDER — PHENYLEPHRINE HCL 10 MG/ML IJ SOLN
INTRAMUSCULAR | Status: DC | PRN
  Administered 2016-07-23 (×2): 80 ug via INTRAVENOUS

## 2016-07-23 MED ORDER — ONDANSETRON HCL 4 MG/2ML IJ SOLN
INTRAMUSCULAR | Status: AC
  Filled 2016-07-23: qty 2

## 2016-07-23 MED ORDER — MIDAZOLAM HCL 2 MG/2ML IJ SOLN
INTRAMUSCULAR | Status: AC
  Filled 2016-07-23: qty 2

## 2016-07-23 MED ORDER — MEPERIDINE HCL 25 MG/ML IJ SOLN: 6.2500 mg | INTRAMUSCULAR | Status: DC | PRN

## 2016-07-23 MED ORDER — MIDAZOLAM HCL 5 MG/5ML IJ SOLN
INTRAMUSCULAR | Status: DC | PRN
  Administered 2016-07-23: 2 mg via INTRAVENOUS

## 2016-07-23 MED ORDER — LIDOCAINE HCL (PF) 1 % IJ SOLN
INTRAMUSCULAR | Status: AC
  Filled 2016-07-23: qty 30

## 2016-07-23 MED ORDER — METOCLOPRAMIDE HCL 5 MG/ML IJ SOLN: 10.0000 mg | Freq: Once | INTRAMUSCULAR | Status: DC | PRN

## 2016-07-23 MED ORDER — PHENYLEPHRINE 40 MCG/ML (10ML) SYRINGE FOR IV PUSH (FOR BLOOD PRESSURE SUPPORT)
PREFILLED_SYRINGE | INTRAVENOUS | Status: AC
  Filled 2016-07-23: qty 10

## 2016-07-23 MED ORDER — INSULIN GLARGINE 100 UNIT/ML ~~LOC~~ SOLN
18.0000 [IU] | Freq: Every day | SUBCUTANEOUS | Status: DC
  Administered 2016-07-23: 18 [IU] via SUBCUTANEOUS
  Filled 2016-07-23 (×2): qty 0.18

## 2016-07-23 MED ORDER — FENTANYL CITRATE (PF) 100 MCG/2ML IJ SOLN
INTRAMUSCULAR | Status: AC
  Filled 2016-07-23: qty 4

## 2016-07-23 MED ORDER — ONDANSETRON HCL 4 MG/2ML IJ SOLN
INTRAMUSCULAR | Status: DC | PRN
  Administered 2016-07-23: 4 mg via INTRAVENOUS

## 2016-07-23 MED ORDER — PROPOFOL 10 MG/ML IV BOLUS
INTRAVENOUS | Status: AC
  Filled 2016-07-23: qty 20

## 2016-07-23 MED ORDER — BUPIVACAINE HCL (PF) 0.25 % IJ SOLN
INTRAMUSCULAR | Status: AC
  Filled 2016-07-23: qty 30

## 2016-07-23 MED ORDER — FENTANYL CITRATE (PF) 100 MCG/2ML IJ SOLN
25.0000 ug | INTRAMUSCULAR | Status: DC | PRN
  Administered 2016-07-23 (×2): 50 ug via INTRAVENOUS

## 2016-07-23 MED ORDER — MORPHINE SULFATE (PF) 2 MG/ML IV SOLN
1.0000 mg | INTRAVENOUS | Status: DC | PRN
  Administered 2016-07-23 (×2): 2 mg via INTRAVENOUS
  Filled 2016-07-23 (×2): qty 1

## 2016-07-23 MED ORDER — CEFAZOLIN SODIUM-DEXTROSE 2-4 GM/100ML-% IV SOLN
2.0000 g | Freq: Three times a day (TID) | INTRAVENOUS | Status: DC
  Administered 2016-07-23 – 2016-07-24 (×4): 2 g via INTRAVENOUS
  Filled 2016-07-23 (×6): qty 100

## 2016-07-23 MED ORDER — 0.9 % SODIUM CHLORIDE (POUR BTL) OPTIME
TOPICAL | Status: DC | PRN
  Administered 2016-07-23: 1000 mL

## 2016-07-23 SURGICAL SUPPLY — 28 items
BNDG GAUZE ELAST 4 BULKY (GAUZE/BANDAGES/DRESSINGS) ×2 IMPLANT
CLEANER TIP ELECTROSURG 2X2 (MISCELLANEOUS) ×2 IMPLANT
COVER SURGICAL LIGHT HANDLE (MISCELLANEOUS) ×2 IMPLANT
DRAPE LAPAROTOMY TRNSV 102X78 (DRAPE) ×2 IMPLANT
DRAPE UTILITY XL STRL (DRAPES) ×4 IMPLANT
ELECT REM PT RETURN 9FT ADLT (ELECTROSURGICAL) ×2
ELECTRODE REM PT RTRN 9FT ADLT (ELECTROSURGICAL) ×1 IMPLANT
GAUZE SPONGE 4X4 12PLY STRL (GAUZE/BANDAGES/DRESSINGS) ×2 IMPLANT
GLOVE BIOGEL PI IND STRL 7.5 (GLOVE) ×1 IMPLANT
GLOVE BIOGEL PI INDICATOR 7.5 (GLOVE) ×1
GLOVE SURG SIGNA 7.5 PF LTX (GLOVE) ×2 IMPLANT
GOWN STRL REUS W/ TWL LRG LVL3 (GOWN DISPOSABLE) ×1 IMPLANT
GOWN STRL REUS W/ TWL XL LVL3 (GOWN DISPOSABLE) ×1 IMPLANT
GOWN STRL REUS W/TWL LRG LVL3 (GOWN DISPOSABLE) ×1
GOWN STRL REUS W/TWL XL LVL3 (GOWN DISPOSABLE) ×1
KIT BASIN OR (CUSTOM PROCEDURE TRAY) ×2 IMPLANT
KIT ROOM TURNOVER OR (KITS) ×2 IMPLANT
NS IRRIG 1000ML POUR BTL (IV SOLUTION) ×2 IMPLANT
PACK SURGICAL SETUP 50X90 (CUSTOM PROCEDURE TRAY) ×2 IMPLANT
PAD ARMBOARD 7.5X6 YLW CONV (MISCELLANEOUS) ×4 IMPLANT
PENCIL BUTTON HOLSTER BLD 10FT (ELECTRODE) ×2 IMPLANT
SPONGE LAP 18X18 X RAY DECT (DISPOSABLE) ×2 IMPLANT
SPONGE LAP 4X18 X RAY DECT (DISPOSABLE) ×2 IMPLANT
SYR BULB 3OZ (MISCELLANEOUS) ×2 IMPLANT
TAPE PAPER 3X10 WHT MICROPORE (GAUZE/BANDAGES/DRESSINGS) ×2 IMPLANT
TOWEL OR 17X24 6PK STRL BLUE (TOWEL DISPOSABLE) ×2 IMPLANT
TOWEL OR 17X26 10 PK STRL BLUE (TOWEL DISPOSABLE) ×2 IMPLANT
WATER STERILE IRR 1000ML POUR (IV SOLUTION) ×2 IMPLANT

## 2016-07-23 NOTE — Progress Notes (Signed)
Inpatient Diabetes Program Recommendations  AACE/ADA: New Consensus Statement on Inpatient Glycemic Control (2015)  Target Ranges:  Prepandial:   less than 140 mg/dL      Peak postprandial:   less than 180 mg/dL (1-2 hours)      Critically ill patients:  140 - 180 mg/dL   Results for Jimmy SacksMOOSO, Shirley (MRN [id number]) as of 07/23/2016 09:39  Ref. Range 07/22/2016 08:41 07/22/2016 12:22 07/22/2016 16:34 07/22/2016 21:20 07/23/2016 08:15  Glucose-Capillary Latest Ref Range: 65 - 99 mg/dL 409267 (H)  Novolog 8 units  Lantus 16 units 237 (H)  Novolog 5 units 233 (H)  Novolog 5 units 172 (H) 249 (H)  Novolog 5 units  Lantus 18 units   Review of Glycemic Control  Diabetes history: DM2 Outpatient Diabetes medications: None Current orders for Inpatient glycemic control: Lantus 18 units daily, Novolog 0-15 units TID with meals  Inpatient Diabetes Program Recommendations: Insulin - Basal: Due to patient not having any Rx coverage with Medicare, please consider switching to 70/30 insulin. If MD switches to 70/30, recommend ordering 70/30 13 units BID with breakfast and supper (will provide a total of 18 units for basal and 8 units for meal coverge per day).   NOTE: Patient has no PCP and has no prescription coverage with Medicare currently. Therefore, patient will need a more affordable insulin. Recommend discharging on Novolin 70/30 13 units BID. Novolin 70/30 can be purchased from [employer] [facility] for $25 per vial.  Thanks, Orlando PennerMarie Autry Prust, RN, MSN, CDE Diabetes Coordinator Inpatient Diabetes Program [phone] (Team Pager from 8am to 5pm) [phone] ([facility] office) [phone] [facility] office) [phone] [facility] office)

## 2016-07-23 NOTE — Op Note (Signed)
07/19/2016 - 07/23/2016  11:58 AM  PATIENT:  Jimmy Castillo, 45 y.o., male, MRN: [id number]  PREOP DIAGNOSIS:  Left Neck Abscess  POSTOP DIAGNOSIS:   Left neck abscess  PROCEDURE:   Procedure(s):  IRRIGATION AND DEBRIDEMENT LEFT NECK ABSCESS  SURGEON:   Ovidio Kinavid Leland Raver, M.D.  ASSISTANT:   none  ANESTHESIA:   general  Anesthesiologist: Mal AmabileMichael Foster, MD CRNA: Andree ElkMichael L McMillen, CRNA  General  EBL:  50 cc  ml  LOCAL MEDICATIONS USED:   None  SPECIMEN:   None  COUNTS CORRECT:  YES  INDICATIONS FOR PROCEDURE:  Jimmy Castillo is a 45 y.o. (DOB: Feb 21, 1971) white male whose primary care physician is Pcp Not In System and comes for I&D of left neck abscess.   [facility] with previously undiagnosed DM and a left neck abscess.  The neck abscess was originally drained 07/19/2016, but had only improved a little.   The indications and risks of the surgery were explained to the patient.  The risks include, but are not limited to, infection, bleeding, and nerve injury.  PROCEDURE:       The patient was taken to [facility] room. He was placed in the left lateral decubitus position. He was started on antibiotics. His left neck was exposed.   A timeout was held and surgical checklist run.   He had a 3 cm incision on his left posterior neck with surrounding pus and inflammation. I expanded this to about a 6-7 cm incision and broke up the loculated abscesses. I then irrigated the wound with a combination of peroxide and saline. I then dressed the wound with a 4 inch Kerlix and saline and sterilely dressed the wound.   The patient tolerated procedure well and transferred to the recovery room. Whether he'll need further debridement will depend on how his wound progresses.

## 2016-07-23 NOTE — Plan of Care (Signed)
Problem: Education: Goal: Knowledge of [facility] Education information/materials will improve Outcome: Progressing Patient educated about surgical procedure per MD, Surgeon, and RN. All questions answered prior to procedure.  Problem: Pain Managment: Goal: General experience of comfort will improve Outcome: Progressing Patient with increased pain s/p surgical procedure today. SO far able to manage pain with Morphine.  Problem: Physical Regulation: Goal: Will remain free from infection Outcome: Progressing Patient continuing to receive antibiotics for abscess. Also pt s/p I and D of neck abscess. Continuing to change dressings and monitor surgical site.  Problem: Nutrition: Goal: Adequate nutrition will be maintained Outcome: Progressing Patient with good appetite after surgery.

## 2016-07-23 NOTE — Care Management Note (Signed)
Case Management Note  Patient Details  Name: Jimmy Castillo MRN: [id number] Date of Birth: 05-31-1971  Subjective/Objective:   Pt discussed options for primary care with his wife and [facility] have decided [facility].  Pt has appointment for Tuesday, 07/30/16 @ 2:15 p.m.  CM discussed SHIIP with pt's wife and she will call to enroll pt in Medicare Part D plan.                         Expected Discharge Plan:  Home/Self Care  Discharge planning Services  CM Consult  Status of Service:  In process, will continue to follow  Magdalene RiverMayo, Kelcee Bjorn T, RN 07/23/2016, 3:45 PM

## 2016-07-23 NOTE — Anesthesia Procedure Notes (Signed)
Procedure Name: Intubation Date/Time: 07/23/2016 11:18 AM Performed by: Orlinda BlalockMCMILLEN, Temari Schooler L Pre-anesthesia Checklist: Patient identified, Emergency Drugs available, Suction available and Patient being monitored Patient Re-evaluated:Patient Re-evaluated prior to inductionOxygen Delivery Method: Circle System Utilized Preoxygenation: Pre-oxygenation with 100% oxygen Intubation Type: IV induction Ventilation: Mask ventilation without difficulty Laryngoscope Size: Mac and 3 Grade View: Grade I Tube type: Oral Tube size: 7.5 mm Number of attempts: 1 Airway Equipment and Method: Stylet Placement Confirmation: ETT inserted through vocal cords under direct vision,  positive ETCO2 and breath sounds checked- equal and bilateral Secured at: 21 cm Tube secured with: Tape Dental Injury: Teeth and Oropharynx as per pre-operative assessment

## 2016-07-23 NOTE — Transfer of Care (Signed)
Immediate Anesthesia Transfer of Care Note  Patient: Brendia SacksBrian Valdes  Procedure(s) Performed: Procedure(s): IRRIGATION AND DEBRIDEMENT LEFT NECK ABSCESS (Left)  Patient Location: PACU  Anesthesia Type:General  Level of Consciousness: awake, alert , oriented and patient cooperative  Airway & Oxygen Therapy: Patient Spontanous Breathing and Patient connected to nasal cannula oxygen  Post-op Assessment: Report given to RN, Post -op Vital signs reviewed and stable and Patient moving all extremities  Post vital signs: Reviewed and stable  Last Vitals:  Vitals:   07/23/16 0430 07/23/16 0814  BP: (!) 126/94 108/82  Pulse: 74 66  Resp: 17 14  Temp: 36.5 C 36.8 C    Last Pain:  Vitals:   07/23/16 0814  TempSrc: Oral  PainSc:       Patients Stated Pain Goal: 2 (07/23/16 0622)  Complications: No apparent anesthesia complications

## 2016-07-23 NOTE — Anesthesia Preprocedure Evaluation (Signed)
Anesthesia Evaluation  Patient identified by MRN, date of birth, ID band Patient awake    Reviewed: Allergy & Precautions, NPO status , Patient's Chart, lab work & pertinent test results  Airway Mallampati: III       Dental no notable dental hx. (+) Teeth Intact   Pulmonary neg pulmonary ROS,    Pulmonary exam normal breath sounds clear to auscultation       Cardiovascular negative cardio ROS Normal cardiovascular exam Rhythm:Regular Rate:Normal     Neuro/Psych negative neurological ROS  negative psych ROS   GI/Hepatic negative GI ROS, Neg liver ROS,   Endo/Other  diabetes, Poorly Controlled, Type 2DKA this admission  Renal/GU negative Renal ROS  negative genitourinary   Musculoskeletal Left neck abscess   Abdominal   Peds  Hematology negative hematology ROS (+)   Anesthesia Other Findings   Reproductive/Obstetrics                               Chemistry      Component Value Date/Time   NA 136 07/23/2016 0737   K 3.8 07/23/2016 0737   CL 102 07/23/2016 0737   CO2 23 07/23/2016 0737   BUN 12 07/23/2016 0737   CREATININE 0.84 07/23/2016 0737      Component Value Date/Time   CALCIUM 8.5 (L) 07/23/2016 0737     Lab Results  Component Value Date   WBC 6.6 07/23/2016   HGB 12.0 (L) 07/23/2016   HCT 34.0 (L) 07/23/2016   MCV 84.2 07/23/2016   PLT 260 07/23/2016   EKG: normal EKG, normal sinus rhythm.  Anesthesia Physical Anesthesia Plan  ASA: II  Anesthesia Plan: General   Post-op Pain Management:    Induction: Intravenous, Rapid sequence and Cricoid pressure planned  Airway Management Planned: Oral ETT  Additional Equipment:   Intra-op Plan:   Post-operative Plan: Extubation in OR  Informed Consent: I have reviewed the patients History and Physical, chart, labs and discussed the procedure including the risks, benefits and alternatives for the proposed anesthesia  with the patient or authorized representative who has indicated his/her understanding and acceptance.   Dental advisory given  Plan Discussed with: CRNA, Anesthesiologist and Surgeon  Anesthesia Plan Comments:         Anesthesia Quick Evaluation

## 2016-07-23 NOTE — Anesthesia Postprocedure Evaluation (Signed)
Anesthesia Post Note  Patient: Jimmy Castillo  Procedure(s) Performed: Procedure(s) (LRB): IRRIGATION AND DEBRIDEMENT LEFT NECK ABSCESS (Left)  Patient location during evaluation: PACU Anesthesia Type: General Level of consciousness: awake and alert and oriented Pain management: pain level controlled Vital Signs Assessment: post-procedure vital signs reviewed and stable Respiratory status: spontaneous breathing, nonlabored ventilation and respiratory function stable Cardiovascular status: blood pressure returned to baseline and stable Postop Assessment: no signs of nausea or vomiting Anesthetic complications: no    Last Vitals:  Vitals:   07/23/16 1248 07/23/16 1303  BP: 105/71 105/76  Pulse: 69 62  Resp: 13 (!) 8  Temp:      Last Pain:  Vitals:   07/23/16 1304  TempSrc:   PainSc: 5                  Makael Stein A.

## 2016-07-23 NOTE — Progress Notes (Signed)
Pharmacy Antibiotic Note  Jimmy SacksBrian Poynter is a 45 y.o. male admitted on 07/19/2016 with a neck abscess.  Initially on vancomycin and wound cx shows MSSA. Pharmacy has been consulted for cefazolin dosing. Went to OR for another I&D today. Afebrile, WBC wnl. SCr stable, CrCl > 18100ml/min.  Plan: Start cefazolin 2g IV Q8 Monitor clinical picture, renal function F/U LOT   Height: 5\' 8"  (172.7 cm) Weight: 189 lb 6 oz (85.9 kg) IBW/kg (Calculated) : 68.4  Temp (24hrs), Avg:97.9 F (36.6 C), Min:97.1 F (36.2 C), Max:98.3 F (36.8 C)   Recent Labs Lab 07/19/16 1043  07/19/16 2034 07/19/16 2252  07/20/16 0555 07/20/16 1011 07/21/16 0305 07/21/16 1310 07/22/16 0358 07/22/16 0855 07/23/16 0530 07/23/16 0737  WBC 16.8*  --   --   --   --  14.8*  --  11.2*  --   --  7.8  --  6.6  CREATININE 1.17  < > 1.02 0.94  < > 0.74 0.87 0.90  --  0.81  --   --  0.84  LATICACIDVEN  --   --  1.1 1.0  --   --   --   --   --   --   --   --   --   VANCOTROUGH  --   --   --   --   --   --   --   --  5*  --   --  19  --   < > = values in this interval not displayed.  Estimated Creatinine Clearance: 117.2 mL/min (by C-G formula based on SCr of 0.84 mg/dL).    Allergies  Allergen Reactions  . Codeine Other (See Comments)    hallucinations  . Other Other (See Comments)    Pt allergic to all opiates - says has "weird effects on him"  . Pork-Derived Products     Antimicrobials this admission: Vanc 10/13 >> 10/17 Clinda 10/15 >> Ancef 10/17 >>  Dose adjustments this admission:  10/15 VT - 5 on 1g q12h (Increase dose to 1g Q8) 10/17 VT = 19 on 1g q8h (Changing to Ancef)  Microbiology results:  10/13 BCx: ngtd 10/15 neck wound: MSSA 10/14 MRSA PCR: NEG  Thank you for allowing pharmacy to be a part of this patient's care.  Enzo BiNathan Vihan Santagata, PharmD, BCPS Clinical Pharmacist Pager [phone] 07/23/2016 1:06 PM

## 2016-07-23 NOTE — Progress Notes (Signed)
Family Medicine Teaching Service Daily Progress Note Intern Pager: [phone]  Patient name: Jimmy Castillo Medical record number: [id number] Date of birth: Dec 10, 1970 Age: 45 y.o. Gender: male  Primary Care Provider: Pcp Not In System Consultants: None Code Status: Full  Pt Overview and Major Events to Date:   10/17: I&D by surgery   Assessment and Plan:  #Left neck abscess/Cellulitis, improving WBC this morning is 6.6 down from11.2. Vanc through 19. Blood cultures show no growth to date. Wound culture grew MSSA. Patient schedule I&D this morning. --Discontinue Vancomycin 1000 mg q8, per pharmacy --Discontinue Clindamycin 600 mg q 8 --Start patient on cefazolin 2g q 8 --Continue Toradol 30mg  q6 --Continue Zofran 4 mg q 6 prn --ContinueTylenol 650 mg q6 prn --Wound care for abscess as needed  --PT consult  #DMII, uncontrolled Blood glucose level continue to be elevated. A1c is 12.0. Patient will most likely need to be started on insulin prior to discharge. Was seen by diabetes coordinator and given DM II education.  --Continue SSI -- Increase Lantus to 18 U --CBGs ACQHS --Diabetic education --KDur 20mg  BID, as needed   FEN/GI: Carb modified, NS 125 cc/hr Prophylaxis: SCD as patient allergic to pork products   Disposition: Will continue to manage hyperglycemia and continue antibiotic regimen for cellulitis  Subjective:  No acute events overnight. Objective: Temp:  [97.7 F (36.5 C)-99.2 F (37.3 C)] 97.7 F (36.5 C) (10/17 0430) Pulse Rate:  [66-80] 74 (10/17 0430) Resp:  [8-17] 17 (10/17 0430) BP: (101-126)/(69-94) 126/94 (10/17 0430) SpO2:  [95 %-96 %] 95 % (10/17 0430)   Physical Exam: General: NAD, sitting up in bed on his computer  Cardiovascular: RRR, no murmurs  Respiratory: CTAB, no signs of acute distress  Abdomen: BS+, no ttp Skin: Abscess with pus drainage from site, surrounding erythema unchanged remains within borders of the marker.    Laboratory:  Recent Labs Lab 07/20/16 0555 07/21/16 0305 07/22/16 0855  WBC 14.8* 11.2* 7.8  HGB 13.1 12.6* 11.9*  HCT 37.2* 36.8* 35.2*  PLT 241 260 252    Recent Labs Lab 07/20/16 1011 07/21/16 0305 07/22/16 0358  NA 135 135 137  K 3.4* 3.4* 3.2*  CL 103 103 102  CO2 22 21* 26  BUN 10 13 14   CREATININE 0.87 0.90 0.81  CALCIUM 8.5* 8.5* 8.3*  GLUCOSE 207* 278* 259*   Ct Soft Tissue Neck W Contrast  Result Date: 07/19/2016 CLINICAL DATA:  Draining sole or of the posterior neck. 3-5 day history. EXAM: CT NECK WITH CONTRAST TECHNIQUE: IMPRESSION: Nonspecific soft tissue swelling within the subcutaneous fat at the left posterior neck from the skullbase to the C6 level. Some involvement of the posterior paraspinous musculature on the left. No evidence of well-circumscribed nonenhancing collection to suggest drainable abscess. Findings are consistent with significant cellulitis. Electronically Signed   By: Paulina FusiMark  Shogry M.D.   On: 07/19/2016 11:33    Lovena NeighboursAbdoulaye Terryl Niziolek, MD 07/23/2016, 6:45 AM PGY-1,  Family Medicine FPTS Intern pager: [phone], text pages welcome

## 2016-07-23 NOTE — Progress Notes (Signed)
PT Cancellation Note  Patient Details Name: Jimmy Castillo MRN: [id number] DOB: [birth year]   Cancelled Treatment:    Reason Eval/Treat Not Completed: Patient at procedure or test/unavailable; patient going down to OR.  Will check back if time permits or return tomorrow.   Elray McgregorCynthia Medha Pippen 07/23/2016, 10:43 AM  Sheran Lawlessyndi Linah Klapper, PT [phone] 07/23/2016

## 2016-07-24 LAB — BASIC METABOLIC PANEL
ANION GAP: 7 (ref 5–15)
BUN: 9 mg/dL (ref 6–20)
CHLORIDE: 103 mmol/L (ref 101–111)
CO2: 26 mmol/L (ref 22–32)
Calcium: 8.3 mg/dL — ABNORMAL LOW (ref 8.9–10.3)
Creatinine, Ser: 0.96 mg/dL (ref 0.61–1.24)
GFR calc non Af Amer: >60 mL/min (ref >60)
GLUCOSE: 243 mg/dL — AB (ref 65–99)
Potassium: 4.5 mmol/L (ref 3.5–5.1)
Sodium: 136 mmol/L (ref 135–145)

## 2016-07-24 LAB — CULTURE, BLOOD (ROUTINE X 2)
CULTURE: NO GROWTH
Culture: NO GROWTH

## 2016-07-24 LAB — CBC
HCT: 35.3 % — ABNORMAL LOW (ref 39.0–52.0)
HEMOGLOBIN: 11.9 g/dL — AB (ref 13.0–17.0)
MCH: 28.9 pg (ref 26.0–34.0)
MCHC: 33.7 g/dL (ref 30.0–36.0)
MCV: 85.7 fL (ref 78.0–100.0)
Platelets: 250 10*3/uL (ref 150–400)
RBC: 4.12 MIL/uL — AB (ref 4.22–5.81)
RDW: 12 % (ref 11.5–15.5)
WBC: 6.8 10*3/uL (ref 4.0–10.5)

## 2016-07-24 LAB — GLUCOSE, CAPILLARY
Glucose-Capillary: 214 mg/dL — ABNORMAL HIGH (ref 65–99)
Glucose-Capillary: 182 mg/dL — ABNORMAL HIGH (ref 65–99)
Glucose-Capillary: 251 mg/dL — ABNORMAL HIGH (ref 65–99)

## 2016-07-24 MED ORDER — HYDROCODONE-ACETAMINOPHEN 5-325 MG PO TABS: 1.0000 | ORAL_TABLET | ORAL | 0 refills | Status: AC | PRN

## 2016-07-24 MED ORDER — INSULIN GLARGINE 100 UNIT/ML ~~LOC~~ SOLN
20.0000 [IU] | Freq: Every day | SUBCUTANEOUS | Status: DC
  Administered 2016-07-24: 20 [IU] via SUBCUTANEOUS
  Filled 2016-07-24: qty 0.2

## 2016-07-24 MED ORDER — CEPHALEXIN 500 MG PO CAPS
500.0000 mg | ORAL_CAPSULE | Freq: Four times a day (QID) | ORAL | Status: DC
  Administered 2016-07-24: 500 mg via ORAL
  Filled 2016-07-24 (×3): qty 1

## 2016-07-24 MED ORDER — INSULIN GLARGINE 100 UNIT/ML ~~LOC~~ SOLN: 20.0000 [IU] | Freq: Every day | SUBCUTANEOUS | 11 refills | Status: DC

## 2016-07-24 MED ORDER — CEPHALEXIN 500 MG PO CAPS: 500.0000 mg | ORAL_CAPSULE | Freq: Four times a day (QID) | ORAL | 0 refills | Status: AC

## 2016-07-24 MED ORDER — INSULIN GLARGINE 100 UNIT/ML SOLOSTAR PEN: 20.0000 [IU] | PEN_INJECTOR | Freq: Every day | SUBCUTANEOUS | 11 refills | Status: DC

## 2016-07-24 MED ORDER — SODIUM CHLORIDE 0.9 % IV BOLUS (SEPSIS)
1000.0000 mL | Freq: Once | INTRAVENOUS | Status: AC
  Administered 2016-07-24: 1000 mL via INTRAVENOUS

## 2016-07-24 NOTE — Care Management Note (Signed)
Case Management Note  Patient Details  Name: Jimmy Castillo MRN: [id number] Date of Birth: [birth year]  Subjective/Objective:    Discussed home health RN services to teach wife to do dressing changes and monitor healing.  Pt declined, stated he would not be homebound.  Staff RN will teach wife to change dressing when she comes to pick up patient.  Pt has card for free 30-day supply of Lantus Solostar Pens.  CM provided him with business card for pharmacist in [facility], he will discuss ongoing insulin requirements @ his appointment on 10/24.                Expected Discharge Plan:  Home/Self Care  Discharge planning Services  CM Consult  HH Arranged:  Patient Refused   Status of Service:  Completed, signed off  Magdalene RiverMayo, Adekunle Rohrbach T, CaliforniaRN 07/24/2016, 5:36 PM

## 2016-07-24 NOTE — Progress Notes (Signed)
Family Medicine Teaching Service Daily Progress Note Intern Pager: [phone]  Patient name: Jimmy SacksBrian Castillo Medical record number: [id number] Date of birth: 1971/09/19 Age: 45 y.o. Gender: male  Primary Care Provider: Pcp Not In System Consultants: None Code Status: Full  Pt Overview and Major Events to Date:   10/17: I&D by surgery   Assessment and Plan:  #Left neck abscess/Cellulitis, improving WBC this morning is 6.8 down from 11.2. Blood cultures NGTD. Wound culture grew MSSA. Patient had I&D yesterday. Seen by surgery this morning wound care perform.  --Continue on cefazolin 2g q8 will transition to Keflex 500 mg qid --Continue Norco 5-325mg  q4 prn will give oxycodone 5 mg for two days prior to discharge --F/u on morning CBC, BMP --Continue Zofran 4 mg q 6 prn --ContinueTylenol 650 mg q6 prn --Wound care: bid wet to dry dressing changes --PT recs --HH consulted for wound care.  #DMII, uncontrolled  A1c is 12.0. Patient will most likely need to be started on insulin prior to discharge. Was seen by diabetes coordinator and given DM II education.  --Continue SSI --Increase Lantus to 20 U --CBGs ACQHS --Diabetic education   FEN/GI: Carb modified, NS 125 cc/hr, replete electrolytes as needed . Prophylaxis: SCD as patient allergic to pork products   Disposition: Will continue to manage hyperglycemia and continue antibiotic regimen for cellulitis  Subjective:  No acute events overnight. Pain is better controlled, patient tolerating po with good urine output.  Objective: Temp:  [97.6 F (36.4 C)-98.5 F (36.9 C)] 98.2 F (36.8 C) (10/18 1200) Pulse Rate:  [68-91] 78 (10/18 1200) Resp:  [13-21] 18 (10/18 0800) BP: (84-122)/(54-92) 122/92 (10/18 1200) SpO2:  [95 %-97 %] 97 % (10/18 1200)   Physical Exam: General: NAD, sitting up in bed on his computer  Cardiovascular: RRR, no murmurs  Respiratory: CTAB, no signs of acute distress  Abdomen: BS+, no ttp Skin: Abscess  with pus drainage from site, surrounding erythema unchanged remains within borders of the marker.   Laboratory:  Recent Labs Lab 07/22/16 0855 07/23/16 0737 07/24/16 0629  WBC 7.8 6.6 6.8  HGB 11.9* 12.0* 11.9*  HCT 35.2* 34.0* 35.3*  PLT 252 260 250    Recent Labs Lab 07/22/16 0358 07/23/16 0737 07/24/16 0629  NA 137 136 136  K 3.2* 3.8 4.5  CL 102 102 103  CO2 26 23 26   BUN 14 12 9   CREATININE 0.81 0.84 0.96  CALCIUM 8.3* 8.5* 8.3*  GLUCOSE 259* 259* 243*   Ct Soft Tissue Neck W Contrast  Result Date: 07/19/2016 CLINICAL DATA:  Draining sole or of the posterior neck. 3-5 day history. EXAM: CT NECK WITH CONTRAST TECHNIQUE: IMPRESSION: Nonspecific soft tissue swelling within the subcutaneous fat at the left posterior neck from the skullbase to the C6 level. Some involvement of the posterior paraspinous musculature on the left. No evidence of well-circumscribed nonenhancing collection to suggest drainable abscess. Findings are consistent with significant cellulitis. Electronically Signed   By: Paulina FusiMark  Castillo M.D.   On: 07/19/2016 11:33    Jimmy NeighboursAbdoulaye Starlet Gallentine, MD 07/24/2016, 2:29 PM PGY-1, [place] [facility] FPTS Intern pager: [phone], text pages welcome

## 2016-07-24 NOTE — Progress Notes (Signed)
07/24/2016 Patient wife was shown how to do dressing change. She was told to loosely pack damped gauze with normal saline and apply dry abdominal pad twice a day. The surgeon stop by and patient was told to follow up with then. Lovie MacadamiaNadine Britnay Magnussen RN

## 2016-07-24 NOTE — Care Management Important Message (Signed)
Important Message  Patient Details  Name: Jimmy SacksBrian Cardenas MRN: [id number] Date of Birth: 1971-10-03   Medicare Important Message Given:  Yes    Vassie Kugel Abena 07/24/2016, 10:59 AM

## 2016-07-24 NOTE — Progress Notes (Signed)
Inpatient Diabetes Program Recommendations  AACE/ADA: New Consensus Statement on Inpatient Glycemic Control (2015)  Target Ranges:  Prepandial:   less than 140 mg/dL      Peak postprandial:   less than 180 mg/dL (1-2 hours)      Critically ill patients:  140 - 180 mg/dL   Lab Results  Component Value Date   GLUCAP 214 (H) 07/24/2016   HGBA1C 12.0 (H) 07/19/2016    Review of Glycemic Control  Diabetes history: DM2 Outpatient Diabetes medications: None Current orders for Inpatient glycemic control: Lantus 20 units daily, Novolog 0-15 units TID with meals  Inpatient Diabetes Program Recommendations:  Insulin - Basal: Due to patient not having any Rx coverage with Medicare, please consider switching to 70/30 insulin. If MD switches to 70/30, Glucose still elevated recommend ordering 70/30 16 units BID with breakfast and supper (will provide a total of 22.4 units for basal and 9.6 units for meal coverge per day).  If patient remains on Lantus, please increase dose fasting glucose still above 200 mg/dl.   Thanks,  Christena DeemShannon Joury Allcorn RN, MSN, [facility]CCN Inpatient Diabetes Coordinator Team Pager [phone] (8a-5p)

## 2016-07-24 NOTE — Progress Notes (Signed)
Patient ID: Jimmy Castillo, male   DOB: 11/22/1970, 45 y.o.   MRN: [id number]  [facility] Surgery Progress Note  1 Day Post-Op  Subjective: Feeling better than yesterday. Dressing has not been changed yet.  Objective: Vital signs in last 24 hours: Temp:  [97 F (36.1 C)-98.5 F (36.9 C)] 97.6 F (36.4 C) (10/18 0800) Pulse Rate:  [57-91] 72 (10/18 0800) Resp:  [8-21] 18 (10/18 0800) BP: (84-113)/(54-84) 112/84 (10/18 0800) SpO2:  [90 %-100 %] 96 % (10/18 0800) Last BM Date: 07/22/16  Intake/Output from previous day: 10/17 0701 - 10/18 0700 In: 1453 [P.O.:400; I.V.:603; IV Piggyback:450] Out: 2525 [Urine:2475; Blood:50] Intake/Output this shift: No intake/output data recorded.  PE: Gen:  Alert, NAD, pleasant Abd: Soft, NT/ND Neck: posterior neck abscess s/p I&D with wound about 7x4cm, trace bloody drainage, tissue pink, mild surrounding erythema  Lab Results:   Recent Labs  07/23/16 0737 07/24/16 0629  WBC 6.6 6.8  HGB 12.0* 11.9*  HCT 34.0* 35.3*  PLT 260 250   BMET  Recent Labs  07/23/16 0737 07/24/16 0629  NA 136 136  K 3.8 4.5  CL 102 103  CO2 23 26  GLUCOSE 259* 243*  BUN 12 9  CREATININE 0.84 0.96  CALCIUM 8.5* 8.3*   PT/INR No results for input(s): LABPROT, INR in the last 72 hours. CMP     Component Value Date/Time   NA 136 07/24/2016 0629   K 4.5 07/24/2016 0629   CL 103 07/24/2016 0629   CO2 26 07/24/2016 0629   GLUCOSE 243 (H) 07/24/2016 0629   BUN 9 07/24/2016 0629   CREATININE 0.96 07/24/2016 0629   CALCIUM 8.3 (L) 07/24/2016 0629   GFRNONAA >60 07/24/2016 0629   GFRAA >60 07/24/2016 0629   Lipase  No results found for: LIPASE     Studies/Results: No results found.  Anti-infectives: Anti-infectives    Start     Dose/Rate Route Frequency Ordered Stop   07/23/16 1430  ceFAZolin (ANCEF) IVPB 2g/100 mL premix     2 g 200 mL/hr over 30 Minutes Intravenous Every 8 hours 07/23/16 1416     07/21/16 2200  vancomycin  (VANCOCIN) IVPB 1000 mg/200 mL premix  Status:  Discontinued     1,000 mg 200 mL/hr over 60 Minutes Intravenous Every 8 hours 07/21/16 1434 07/23/16 1259   07/21/16 1400  clindamycin (CLEOCIN) IVPB 600 mg  Status:  Discontinued     600 mg 100 mL/hr over 30 Minutes Intravenous Every 8 hours 07/21/16 1129 07/24/16 0827   07/20/16 0100  vancomycin (VANCOCIN) IVPB 1000 mg/200 mL premix  Status:  Discontinued     1,000 mg 200 mL/hr over 60 Minutes Intravenous Every 12 hours 07/19/16 1227 07/21/16 1434   07/19/16 1230  vancomycin (VANCOCIN) 1,500 mg in sodium chloride 0.9 % 500 mL IVPB     1,500 mg 250 mL/hr over 120 Minutes Intravenous  Once 07/19/16 1216 07/19/16 1515   07/19/16 1215  vancomycin (VANCOCIN) IVPB 1000 mg/200 mL premix  Status:  Discontinued     1,000 mg 200 mL/hr over 60 Minutes Intravenous  Once 07/19/16 1214 07/19/16 1216       Assessment/Plan Procedure(s):  IRRIGATION AND DEBRIDEMENT LEFT NECK ABSCESS 07/23/16 Dr. Ezzard Standing - POD 1 - WBC 6.8, afebrile - culture prior to surgery showed STAPHYLOCOCCUS AUREUS Susceptibility    Staphylococcus aureus   MIC  CIPROFLOXACIN <=0.5 SENSITIVE "><=0.5 SENSI... Sensitive  CLINDAMYCIN <=0.25 SENSITIVE "><=0.25 SENS... Sensitive  ERYTHROMYCIN <=0.25 SENSITIVE "><=0.25 SENS... Sensitive  GENTAMICIN <=0.5 SENSITIVE "><=0.5 SENSI... Sensitive  Inducible Clindamycin NEGATIVE  Sensitive  OXACILLIN <=0.25 SENSITIVE "><=0.25 SENS... Sensitive  RIFAMPIN <=0.5 SENSITIVE "><=0.5 SENSI... Sensitive  TETRACYCLINE <=1 SENSITIVE "><=1 SENSITIVE  Sensitive  TRIMETH/SULFA <=10 SENSITIVE "><=10 SENSIT... Sensitive  VANCOMYCIN 1 SENSITIVE  Sensitive    DM - BG 243 this AM Chronic disability  ID - ancef 10/17>>, clinda 10/15>>10/18, vanc 10/14>>10/17 FEN - carb modified VTE - SCDs  Plan - packing removed and loosely repacked with saline soaked gauze, dry abd pad applied. Continue BID wet to dry dressing changes. Encourage shower when packing  is removed and replace packing after shower. Heat PRN. Continue IV antibiotics while in the hospital.   From a surgical standpoint patient could likely go home in 1-2 days on PO antibiotics and follow-up with Dr. Ezzard StandingNewman in 7 days. I will consult HH for assistance with wound care.   LOS: 5 days    Edson SnowballBROOKE A MILLER , [facility] 07/24/2016, 11:33 AM Pager: [phone] Consults: [phone] Mon-Fri 7:00 am-4:30 pm Sat-Sun 7:00 am-11:30 am  Packing up to go home. Wife and daughter in room.  Wound is doing well. Needs follow up with me next week.  Ovidio Kinavid Ondra Deboard, MD, [facility] Pager: [phone] Office phone:  [phone]

## 2016-07-24 NOTE — Progress Notes (Signed)
Transitions of Care Pharmacy Note  Plan:  Educated on how to administer insulin, disposal of needles, and blood sugar monitoring. Patient and wife seemed to have a good grasp on insulin regimen. Addressed concerns regarding cost of his insulin. Educated on differences between Medicare Parts B and D and told him to utilize the 30 day free trial card for Lantus pens that he was given while he is finding out what his copay will be on Medicare.   Jimmy SacksBrian Castillo is an 45 y.o. male who presents with a chief complaint of left neck pain and cellulitus secondary to large neck abscess. In anticipation of discharge, pharmacy has reviewed this patient's prior to admission medication history, as well as current inpatient medications [facility].  Current medication indications, dosing, frequency, and notable side effects reviewed with patient and family. patient verbalized understanding of current inpatient medication regimen and is aware that the After Visit Summary when presented, will represent the most accurate medication list at discharge.   Jimmy Castillo expressed concerns regarding his Medicare coverage of insulin. I instructed him to contact the pharmacy with any issues in coverage.   Assessment: Understanding of regimen: good Understanding of indications: good Potential of compliance: good Barriers to Obtaining Medications: Yes- Potential copay   Patient instructed to contact inpatient pharmacy team with further questions or concerns if needed.    Time spent preparing for discharge counseling: 10 minutes Time spent counseling patient: 15-20 minutes.    Thank you for allowing pharmacy to be a part of this patient's care.  Hillis RangeEmily A Stewart, PharmD PGY1 Pharmacy Resident [phone]

## 2016-07-24 NOTE — Evaluation (Signed)
Physical Therapy Evaluation Patient Details Name: Jimmy Castillo MRN: [id number] DOB: 10-Jul-1971 Today's Date: 07/24/2016   History of Present Illness  Jimmy Castillo is a 45 y.o. male admitted on 07/19/2016 with a neck abscess.    Clinical Impression  Pt admitted with above diagnosis. Pt currently with functional limitations due to the deficits listed below (see PT Problem List). Pt ambulated a good distance and with good safety with min challenges to balance. Asked nursing to ambulate with pt as well 3 x day. Will check back on pt to ensure mobility and perform balance activities.   Pt will benefit from skilled PT to increase their independence and safety with mobility to allow discharge to the venue listed below.      Follow Up Recommendations No PT follow up    Equipment Recommendations  None recommended by PT    Recommendations for Other Services       Precautions / Restrictions Precautions Precautions: Fall Restrictions Weight Bearing Restrictions: No      Mobility  Bed Mobility               General bed mobility comments: in chair on arrival  Transfers Overall transfer level: Needs assistance Equipment used: None Transfers: Sit to/from Stand Sit to Stand: Min guard         General transfer comment: No assist needed to stand.  Pt does rock using momentum  to get up.  Ambulation/Gait Ambulation/Gait assistance: Min guard Ambulation Distance (Feet): 500 Feet Assistive device: None Gait Pattern/deviations: Step-through pattern;Decreased stride length   Gait velocity interpretation: <1.8 ft/sec, indicative of risk for recurrent falls General Gait Details: Pt without LOB with ambulation with min challenges to balance.  States his LEs give out at times but no difficulty during this walk.    Stairs            Wheelchair Mobility    Modified Rankin (Stroke Patients Only)       Balance Overall balance assessment: No apparent balance deficits (not  formally assessed)                                           Pertinent Vitals/Pain Pain Assessment: 0-10 Pain Score: 6  Pain Location: bil LEs and back Pain Descriptors / Indicators: Aching Pain Intervention(s): Limited activity within patient's tolerance;Monitored during session;Repositioned  VSS  Home Living Family/patient expects to be discharged to:: Private residence Living Arrangements: Spouse/significant other;Children Available Help at Discharge: Family;Available PRN/intermittently Type of Home: House Home Access: Stairs to enter Entrance Stairs-Rails: None Entrance Stairs-Number of Steps: 3 Home Layout: One level Home Equipment: None      Prior Function Level of Independence: Independent         Comments: Pt reports previous back injury years ago that causes pain in LEs intermittently.     Hand Dominance        Extremity/Trunk Assessment   Upper Extremity Assessment: Defer to OT evaluation           Lower Extremity Assessment: Generalized weakness      Cervical / Trunk Assessment: Normal  Communication   Communication: No difficulties  Cognition Arousal/Alertness: Awake/alert Behavior During Therapy: WFL for tasks assessed/performed Overall Cognitive Status: Within Functional Limits for tasks assessed                      General Comments  Exercises     Assessment/Plan    PT Assessment Patient needs continued PT services  PT Problem List Decreased activity tolerance;Decreased balance;Decreased mobility;Decreased knowledge of use of DME;Decreased safety awareness;Decreased knowledge of precautions          PT Treatment Interventions DME instruction;Gait training;Functional mobility training;Therapeutic activities;Therapeutic exercise;Balance training;Patient/family education    PT Goals (Current goals can be found in the Care Plan section)  Acute Rehab PT Goals Patient Stated Goal: to go home PT Goal  Formulation: With patient Time For Goal Achievement: 07/31/16 Potential to Achieve Goals: Good    Frequency Min 3X/week   Barriers to discharge Decreased caregiver support      Co-evaluation               End of Session Equipment Utilized During Treatment: Gait belt Activity Tolerance: Patient tolerated treatment well Patient left: in chair;with call bell/phone within reach;with chair alarm set Nurse Communication: Mobility status (Nursing asked to ambulate with pt 3x day.)         Time: 1215-1229 PT Time Calculation (min) (ACUTE ONLY): 14 min   Charges:   PT Evaluation $PT Eval Low Complexity: 1 Procedure     PT G CodesBerline Lopes 23-Aug-2016, 2:37 PM [facility] [phone] (pager)

## 2016-07-26 ENCOUNTER — Encounter (HOSPITAL_COMMUNITY): Payer: Self-pay | Admitting: Surgery

## 2016-07-26 NOTE — Discharge Summary (Signed)
Family Medicine Teaching [facility] Discharge Summary  Patient name: Jimmy Castillo Medical record number: [id number] Date of birth: Jan 21, 1971 Age: 45 y.o. Gender: male Date of Admission: 07/19/2016  Date of Discharge: 07/24/2016 Admitting Physician: Uvaldo RisingKyle J Fletke, MD  Primary Care Provider: Pcp Not In System Consultants: Surgery  Indication for Hospitalization: Left neck abscess, cellulitis and pain  Discharge Diagnoses/Problem List:  Left neck abscess Cellulitis  DM2  Disposition: Home  Discharge Condition: Stable  Discharge Exam:  General: NAD, sitting up in bed able to answer question and participate in exam Cardiovascular: RRR, no murmurs  Respiratory: CTAB, no signs of acute distress  Abdomen: BS+, no ttp Skin Neck: Abscess incised no purulent discharge from site, serosnaguinous drainage from site, surrounding erythema receeding and well within initial  Borders.  Brief Hospital Course: Jimmy Castillo is a 45 y.o. male  with a past medical history significant for DM II who presented with left neck pain and cellulitis secondary to large abscess in the posterior aspect of the neck behind SCM.  #Left neck abscess with surrounding cellulitis, resolved s/p I&D Patient initially admitted for left neck abscess with surrounding cellulitis Patient with initial elevated WBC 16.8. Abscess was  I&D by ED provider with significant purulent discharge and started on vancomycin. Abscess continued to drain, surgery was consulted and they added clindamycin. There were no improvement in pain and drainage despite antibiotic coverage. Patient taken to the OR on 10/17 for I&D. Post surgery cellulitis improved, swelling decrease and no urulence drainage was noted. Patient was stable and improving and was sent home with Keflex and specific wound care instructions.  #DKA, resolved  On presentation, patient blood glucose was 291. Hyperglycemia most likely secondary to poor control and ongoing  infection. Anion gap was 18, though pH of 7.280 and UA showed ketonuria consistent with possibly mild DKA in the setting of infection. Potassium slightly low at 3.3. Patient strarted on insulin drip gap quickly close overnight and transitioned to sub cutaneous insulin.  #DMII, uncontrolled  A1c 12.0. Patient with history of uncontrolled DMII, while inpatient BG elevated in the setting of infection. Patient started on Lantus for consitently elevated blood glucose level. Blood glucose was better controlled. Patientreceived DM education by DM coordinator and reinforced by pharmacy staff. Patient given  Free Solostar insulin coucher for 30 day free refill while sorting out insurance issues. --Discharged on Lantus 23 U --Diabetic education received --Follow up with PCP for optimization of regimen  Issues for Follow Up:  1. Follow up on completion of antibiotic course Keflex 500 TID end date 10/21 2. Patient was discharged on Lantus for uncontrolled DM2,  F/u on BG levels 3. Follow up of wound care and surgery appointment 4. Consider optimization of diabetes regimen  Significant Procedures:  I&D left neck abscess on 10/13 in the ED I&D left neck abscess on 10/17 by surgery  Significant Labs and Imaging:   Recent Labs Lab 07/22/16 0855 07/23/16 0737 07/24/16 0629  WBC 7.8 6.6 6.8  HGB 11.9* 12.0* 11.9*  HCT 35.2* 34.0* 35.3*  PLT 252 260 250    Recent Labs Lab 07/20/16 1011 07/21/16 0305 07/22/16 0358 07/23/16 0737 07/24/16 0629  NA 135 135 137 136 136  K 3.4* 3.4* 3.2* 3.8 4.5  CL 103 103 102 102 103  CO2 22 21* 26 23 26   GLUCOSE 207* 278* 259* 259* 243*  BUN 10 13 14 12 9   CREATININE 0.87 0.90 0.81 0.84 0.96  CALCIUM 8.5* 8.5* 8.3* 8.5* 8.3*  Ct Soft Tissue Neck W Contrast  Result Date: 07/19/2016 CLINICAL DATA:  Draining sole or of the posterior neck. 3-5 day history. EXAM: CT NECK WITH CONTRAST TECHNIQUE: Multidetector CT imaging of the neck was performed using the  standard protocol following the bolus administration of intravenous contrast. CONTRAST:  75 cc Isovue-300 COMPARISON:  None. FINDINGS: Pharynx and larynx: No mucosal or submucosal lesion. Salivary glands: Submandibular and parotid glands are normal. Thyroid: Normal. Lymph nodes: Mild reactive nodal prominence bilaterally, left more than right. No to dominant enlarged or low-density node. Vascular: Normal Limited intracranial: Normal Visualized orbits: Normal Mastoids and visualized paranasal sinuses: Normal Skeleton: No significant finding. Probable hemangioma within the C6 vertebral body. Upper chest: Normal Other: There is nonspecific soft tissue swelling within the subcutaneous fat of the left posterior neck from the skullbase to the C6 level consistent with nonspecific cellulitis. Some inflammation affects the paraspinous musculature on the left. No evidence of nonenhancing low-density collection to suggest drainable abscess. IMPRESSION: Nonspecific soft tissue swelling within the subcutaneous fat at the left posterior neck from the skullbase to the C6 level. Some involvement of the posterior paraspinous musculature on the left. No evidence of well-circumscribed nonenhancing collection to suggest drainable abscess. Findings are consistent with significant cellulitis. Electronically Signed   By: Paulina Fusi M.D.   On: 07/19/2016 11:33   Results/Tests Pending at Time of Discharge: None   Discharge Medications:    Medication List    STOP taking these medications   traMADol-acetaminophen 37.5-325 MG tablet Commonly known as:  ULTRACET     TAKE these medications   cephALEXin 500 MG capsule Commonly known as:  KEFLEX Take 1 capsule (500 mg total) by mouth 4 (four) times daily.   diazepam 5 MG tablet Commonly known as:  VALIUM Take 1 tablet (5 mg total) by mouth every 12 (twelve) hours as needed for muscle spasms.   diphenhydrAMINE 25 mg capsule Commonly known as:  BENADRYL Take 25 mg by mouth  every 6 (six) hours as needed for itching.   HYDROcodone-acetaminophen 5-325 MG tablet Commonly known as:  NORCO/VICODIN Take 1 tablet by mouth every 4 (four) hours as needed for moderate pain.   ibuprofen 200 MG tablet Commonly known as:  ADVIL,MOTRIN Take 200 mg by mouth every 6 (six) hours as needed for moderate pain.   Insulin Glargine 100 UNIT/ML Solostar Pen Commonly known as:  LANTUS SOLOSTAR Inject 20 Units into the skin daily at 10 pm.   insulin glargine 100 UNIT/ML injection Commonly known as:  LANTUS Inject 0.2 mLs (20 Units total) into the skin daily.       Discharge Instructions: Please refer to Patient Instructions section of EMR for full details.  Patient was counseled important signs and symptoms that should prompt return to medical care, changes in medications, dietary instructions, activity restrictions, and follow up appointments.   Follow-Up Appointments: Follow-up Information    [facility] .   Why:  APPOINTMENT TUESDAY, OCTOBER 24th, @ 2:15 p.m. Contact information: [address] [place] [address] [phone]          Lovena Neighbours, MD 07/26/2016, 11:08 PM PGY-1, [facility]

## 2016-07-30 ENCOUNTER — Ambulatory Visit (INDEPENDENT_AMBULATORY_CARE_PROVIDER_SITE_OTHER): Payer: Medicare Other | Admitting: Internal Medicine

## 2016-07-30 VITALS — BP 111/72 | HR 98 | Temp 98.0°F | Wt 188.9 lb

## 2016-07-30 DIAGNOSIS — Z8619 Personal history of other infectious and parasitic diseases: Secondary | ICD-10-CM | POA: Diagnosis not present

## 2016-07-30 DIAGNOSIS — X58XXXD Exposure to other specified factors, subsequent encounter: Secondary | ICD-10-CM

## 2016-07-30 DIAGNOSIS — Z794 Long term (current) use of insulin: Secondary | ICD-10-CM

## 2016-07-30 DIAGNOSIS — Z872 Personal history of diseases of the skin and subcutaneous tissue: Secondary | ICD-10-CM | POA: Diagnosis not present

## 2016-07-30 DIAGNOSIS — S1189XD Other open wound of other specified part of neck, subsequent encounter: Secondary | ICD-10-CM

## 2016-07-30 DIAGNOSIS — E1165 Type 2 diabetes mellitus with hyperglycemia: Secondary | ICD-10-CM

## 2016-07-30 DIAGNOSIS — Z09 Encounter for follow-up examination after completed treatment for conditions other than malignant neoplasm: Secondary | ICD-10-CM

## 2016-07-30 DIAGNOSIS — L0291 Cutaneous abscess, unspecified: Secondary | ICD-10-CM | POA: Diagnosis present

## 2016-07-30 DIAGNOSIS — IMO0002 Reserved for concepts with insufficient information to code with codable children: Secondary | ICD-10-CM

## 2016-07-30 DIAGNOSIS — E118 Type 2 diabetes mellitus with unspecified complications: Secondary | ICD-10-CM

## 2016-07-30 DIAGNOSIS — L0211 Cutaneous abscess of neck: Secondary | ICD-10-CM

## 2016-07-30 LAB — GLUCOSE, CAPILLARY: Glucose-Capillary: 373 mg/dL — ABNORMAL HIGH (ref 65–99)

## 2016-07-30 NOTE — Patient Instructions (Addendum)
You were seen in clinic today for hospital follow-up of the abscess on your neck.  For the infection on your neck, you have finished appropriate antibiotic treatment and there are no signs of continuing infection.  However, you do have a large wound that would benefit from continuing wound care.  We will give you more teaching regarding wound care, and refer you to the wound care service.  For your diabetes, keep taking the Lantus as you have been.  Schedule another appointment to establish regular care with the clinic.  Please follow up with the surgeon as scheduled on Thursday.  If you develop fevers, chills, nausea, vomiting, new pus draining from your neck, or are unable to eat and drink, please go to the ED.  To care for your wound, pack the edges of the wound with the Iodoform packing material, using a Q-tip to gently push it in under the edges of the wound.  Then loosely pack with gauze to hold the Iodoform in place, and cover loosely.  Do no use tape.  Change the dressing daily; wet the gauze to make it easier to remove.  It is okay to let soap and water run over the wound, but do not submerge in water, let the shower head flow directly onto it, or scrub the wound.

## 2016-07-30 NOTE — Assessment & Plan Note (Addendum)
His infection seems to have resolved, with no constitutional signs/symptoms, no purulent drainage, and full course of appropriate antibiotics.  His wound is large and deep, and will require wound care expertise to heal.  -Iodoform packing, loosely packed with gauze, occlusive dressing, change daily, wet to remove -Refer to wound care -Follow-up with surgeon Dr Ezzard StandingNewman on Thursday -RTC if fevers or purulent drainage

## 2016-07-30 NOTE — Assessment & Plan Note (Addendum)
Lab Results  Component Value Date   HGBA1C 12.0 (H) 07/19/2016     Recent Labs  07/30/16 1417  GLUCAP 373*     Current medications: none Current insulin: Lantus 23U QHS  Assessment HgbA1c goal: 7.0 Glycemic control: uncontrolled Complications: unknown  Plan Medications: none Insulin: continue Lantus 23U QHS Other: -Continue checking CBGs -RTC to establish regular care and optimize diabetic regimen

## 2016-07-30 NOTE — Progress Notes (Signed)
CC: neck wound  HPI:  [name] is a 45 y.o. man with history of DM2 who presents for hospital follow-up of left neck abscess and cellulitis complicated by DKA.  He was admitted to the Family Medicine service 10/13 - 10/18 after presenting with sepsis and DKA secondary to neck abscess and cellulitis.  The abscess was treated with I&D, and he was treated with vancomycin and clindamycin.  Wound culture grew MSSA. He was discharged with oral cephalexin through 10/21 to complete 7 days of antibiotics.  His DKA was treated with insulin and fluids, and he was discharged on 23U Lantus QHS.  Since discharge, he has been afebrile, and eating and drinking well.  Has not received Home Health services, was told by someone at hospital he was not eligible because he was not home bound.  Irritation from tape around wound.  Says he has appointment to follow-up with surgeon this Thursday.  He has a glucometer, and has been checking AM BGs, mostly in 200s.  No lows.  Dx with diabetes ~4 years ago, incidentally with BG check after trauma.  [facility], [place]. No PCP at that time.  Moved to [place] 3 years ago, saw doctor in [facility] near [place].  Dissatisfied with pain from prior I&D.  On metformin for months, unsure of dose.  Stopped because of frustration with medical care.  On insulin since moving to [place], on shot per day, maybe 10-12U.  Stopped a couple years ago because of variable sugars and home CBGs being variable.    Has had episodes of diaphoresis, nausea, pallor, with exercise.  Improves with snack.  He had a prior abscess 2 years ago on the right side of his neck, but did not required hospitalization. .  Past Medical History:  Diagnosis Date  . Diabetes mellitus without complication (HCC)     Review of Systems:   Review of Systems  Constitutional: Negative for chills and fever.  Respiratory: Negative for shortness of breath.   Cardiovascular: Negative for chest pain.    Gastrointestinal: Negative for diarrhea, nausea and vomiting.  Musculoskeletal: Positive for back pain.  Skin:       Wound on neck.  No purulent exudate.  Neurological: Positive for headaches.  Psychiatric/Behavioral: Positive for depression. The patient is nervous/anxious.    Physical Exam:  Vitals:   07/30/16 1407  BP: 111/72  Pulse: 98  Temp: 98 F (36.7 C)  TempSrc: Oral  SpO2: 99%  Weight: 188 lb 14.4 oz (85.7 kg)    Physical Exam  Constitutional: He is oriented to person, place, and time.  Somewhat disheveled-looking man in no distress  Cardiovascular: Normal rate and regular rhythm.   Pulmonary/Chest: Effort normal and breath sounds normal.  Neurological: He is alert and oriented to person, place, and time.  Skin:  ~3x6 cm diamater wound in posterior left neck with exposed fascia and surrounding moderate erythema and induration.  No purulent exudate.  Tissue appear pink and viable.  Psychiatric: He has a normal mood and affect. His behavior is normal.     Lab Results  Component Value Date   HGBA1C 12.0 (H) 07/19/2016   CBC Latest Ref Rng & Units 07/24/2016 07/23/2016 07/22/2016  WBC 4.0 - 10.5 K/uL 6.8 6.6 7.8  Hemoglobin 13.0 - 17.0 g/dL 11.9(L) 12.0(L) 11.9(L)  Hematocrit 39.0 - 52.0 % 35.3(L) 34.0(L) 35.2(L)  Platelets 150 - 400 K/uL 250 260 252   CMP Latest Ref Rng & Units 07/24/2016 07/23/2016 07/22/2016  Glucose 65 - 99 mg/dL 161(W243(H) 960(A259(H) 540(J259(H)  BUN 6 - 20 mg/dL 9 12 14   Creatinine 0.61 - 1.24 mg/dL 8.110.96 9.140.84 7.820.81  Sodium 135 - 145 mmol/L 136 136 137  Potassium 3.5 - 5.1 mmol/L 4.5 3.8 3.2(L)  Chloride 101 - 111 mmol/L 103 102 102  CO2 22 - 32 mmol/L 26 23 26   Calcium 8.9 - 10.3 mg/dL 8.3(L) 8.5(L) 8.3(L)      Assessment & Plan:   See Encounters Tab for problem based charting.  Patient seen with Dr. Oswaldo DoneVincent

## 2016-07-31 NOTE — Progress Notes (Signed)
Internal Medicine Clinic Attending  I saw and evaluated the patient.  I personally confirmed the key portions of the history and exam documented by Dr. O'Sullivan and I reviewed pertinent patient test results.  The assessment, diagnosis, and plan were formulated together and I agree with the documentation in the resident's note.   

## 2016-08-12 ENCOUNTER — Encounter (HOSPITAL_BASED_OUTPATIENT_CLINIC_OR_DEPARTMENT_OTHER): Payer: Medicare Other | Attending: Internal Medicine

## 2016-08-12 DIAGNOSIS — E11622 Type 2 diabetes mellitus with other skin ulcer: Secondary | ICD-10-CM | POA: Insufficient documentation

## 2016-08-12 DIAGNOSIS — L0211 Cutaneous abscess of neck: Secondary | ICD-10-CM | POA: Insufficient documentation

## 2016-08-12 DIAGNOSIS — M436 Torticollis: Secondary | ICD-10-CM | POA: Diagnosis not present

## 2016-08-12 DIAGNOSIS — L98492 Non-pressure chronic ulcer of skin of other sites with fat layer exposed: Secondary | ICD-10-CM | POA: Diagnosis not present

## 2016-08-12 DIAGNOSIS — L089 Local infection of the skin and subcutaneous tissue, unspecified: Secondary | ICD-10-CM | POA: Diagnosis not present

## 2016-08-12 DIAGNOSIS — B9561 Methicillin susceptible Staphylococcus aureus infection as the cause of diseases classified elsewhere: Secondary | ICD-10-CM | POA: Diagnosis not present

## 2016-08-20 DIAGNOSIS — E11622 Type 2 diabetes mellitus with other skin ulcer: Secondary | ICD-10-CM | POA: Diagnosis not present

## 2016-08-20 DIAGNOSIS — L98492 Non-pressure chronic ulcer of skin of other sites with fat layer exposed: Secondary | ICD-10-CM | POA: Diagnosis not present

## 2016-08-20 DIAGNOSIS — E119 Type 2 diabetes mellitus without complications: Secondary | ICD-10-CM | POA: Diagnosis not present

## 2016-08-20 DIAGNOSIS — M436 Torticollis: Secondary | ICD-10-CM | POA: Diagnosis not present

## 2016-08-20 DIAGNOSIS — L0211 Cutaneous abscess of neck: Secondary | ICD-10-CM | POA: Diagnosis not present

## 2016-08-26 ENCOUNTER — Encounter: Payer: Medicare Other | Attending: Internal Medicine | Admitting: *Deleted

## 2016-08-26 ENCOUNTER — Encounter: Payer: Self-pay | Admitting: *Deleted

## 2016-08-26 DIAGNOSIS — E118 Type 2 diabetes mellitus with unspecified complications: Secondary | ICD-10-CM

## 2016-08-26 DIAGNOSIS — E119 Type 2 diabetes mellitus without complications: Secondary | ICD-10-CM | POA: Diagnosis not present

## 2016-08-26 DIAGNOSIS — E1165 Type 2 diabetes mellitus with hyperglycemia: Secondary | ICD-10-CM

## 2016-08-26 DIAGNOSIS — IMO0002 Reserved for concepts with insufficient information to code with codable children: Secondary | ICD-10-CM

## 2016-08-26 DIAGNOSIS — Z713 Dietary counseling and surveillance: Secondary | ICD-10-CM | POA: Diagnosis not present

## 2016-08-26 NOTE — Progress Notes (Signed)
Diabetes Self-Management Education  Visit Type: First/Initial  Appt. Start Time: 1400 Appt. End Time: 1500  08/26/2016  Mr. Jimmy Castillo, identified by name and date of birth, is a 45 y.o. male with a diagnosis of Diabetes: Type 2.   ASSESSMENT  Is concerned about why his glucose is varied?  Feels like not taking Lantus did not seem to make a difference. Checks glucose twice daily  Had an abscess on his neck and that was pretty severe .  He was hospitalized as a result and found out he had diabetes. Is not seeing endocrinology       Diabetes Self-Management Education - 08/26/16 1413      Visit Information   Visit Type First/Initial     Initial Visit   Diabetes Type Type 2   Are you currently following a meal plan? No   Are you taking your medications as prescribed? Yes     Health Coping   How would you rate your overall health? Fair     Psychosocial Assessment   Patient Belief/Attitude about Diabetes Defeat/Burnout   Self-care barriers Lack of material resources   Self-management support Family   Other persons present Spouse/SO   Patient Concerns Medication;Monitoring;Healthy Lifestyle;Problem Solving;Glycemic Control;Support   Special Needs None   Preferred Learning Style No preference indicated   Learning Readiness Ready   How often do you need to have someone help you when you read instructions, pamphlets, or other written materials from your doctor or pharmacy? 1 - Never   What is the last grade level you completed in school? 12th     Pre-Education Assessment   Patient understands the diabetes disease and treatment process. Needs Instruction   Patient understands incorporating nutritional management into lifestyle. Needs Instruction   Patient undertands incorporating physical activity into lifestyle. Needs Instruction   Patient understands using medications safely. Needs Instruction   Patient understands monitoring blood glucose, interpreting and using results  Needs Instruction   Patient understands prevention, detection, and treatment of acute complications. Needs Instruction   Patient understands prevention, detection, and treatment of chronic complications. Needs Instruction   Patient understands how to develop strategies to address psychosocial issues. Needs Instruction   Patient understands how to develop strategies to promote health/change behavior. Needs Instruction     Complications   Last HgB A1C per patient/outside source 12 %   How often do you check your blood sugar? 1-2 times/day   Fasting Blood glucose range (mg/dL) >130>200   Postprandial Blood glucose range (mg/dL) >865>200   Number of hypoglycemic episodes per month 0   Have you had a dilated eye exam in the past 12 months? No   Have you had a dental exam in the past 12 months? No   Are you checking your feet? Yes   How many days per week are you checking your feet? 5     Dietary Intake   Breakfast chicken leg   Lunch salad   Dinner burger with salad   Beverage(s) fizzy water, sometimes soda     Exercise   Exercise Type Light (walking / raking leaves)   How many days per week to you exercise? 5   How many minutes per day do you exercise? 15   Total minutes per week of exercise 75     Patient Education   Previous Diabetes Education Yes (please comment)  inpatient   Disease state  Definition of diabetes, type 1 and 2, and the diagnosis of diabetes;Factors that contribute to  the development of diabetes;Explored patient's options for treatment of their diabetes   Nutrition management  Role of diet in the treatment of diabetes and the relationship between the three main macronutrients and blood glucose level;Effects of alcohol on blood glucose and safety factors with consumption of alcohol.;Information on hints to eating out and maintain blood glucose control.   Physical activity and exercise  Role of exercise on diabetes management, blood pressure control and cardiac health.;Helped  patient identify appropriate exercises in relation to his/her diabetes, diabetes complications and other health issue.   Medications Other (comment)  suggested referral to endo for medication management   Monitoring Purpose and frequency of SMBG.;Taught/discussed recording of test results and interpretation of SMBG.;Yearly dilated eye exam   Acute complications Taught treatment of hypoglycemia - the 15 rule.;Discussed and identified patients' treatment of hyperglycemia.   Chronic complications Relationship between chronic complications and blood glucose control   Psychosocial adjustment Worked with patient to identify barriers to care and solutions;Role of stress on diabetes   Personal strategies to promote health Lifestyle issues that need to be addressed for better diabetes care     Individualized Goals (developed by patient)   Nutrition General guidelines for healthy choices and portions discussed   Physical Activity Exercise 3-5 times per week   Medications Other (comment)  talk with endo   Monitoring  test my blood glucose as discussed   Reducing Risk examine blood glucose patterns     Post-Education Assessment   Patient understands the diabetes disease and treatment process. Needs Review   Patient understands incorporating nutritional management into lifestyle. Needs Review   Patient undertands incorporating physical activity into lifestyle. Needs Review   Patient understands using medications safely. Needs Review   Patient understands monitoring blood glucose, interpreting and using results Needs Review   Patient understands prevention, detection, and treatment of acute complications. Needs Review   Patient understands prevention, detection, and treatment of chronic complications. Needs Review   Patient understands how to develop strategies to address psychosocial issues. Needs Review   Patient understands how to develop strategies to promote health/change behavior. Needs Review      Outcomes   Expected Outcomes Demonstrated interest in learning. Expect positive outcomes   Future DMSE PRN   Program Status Not Completed      Individualized Plan for Diabetes Self-Management Training:   Learning Objective:  Patient will have a greater understanding of diabetes self-management. Patient education plan is to attend individual and/or group sessions per assessed needs and concerns.   Plan:   There are no Patient Instructions on file for this visit.  Expected Outcomes:  Demonstrated interest in learning. Expect positive outcomes  Education material provided: Living Well with Diabetes and Meal plan card  If problems or questions, patient to contact team via:  Phone  Future DSME appointment: PRN

## 2016-08-27 DIAGNOSIS — L98492 Non-pressure chronic ulcer of skin of other sites with fat layer exposed: Secondary | ICD-10-CM | POA: Diagnosis not present

## 2016-08-27 DIAGNOSIS — M436 Torticollis: Secondary | ICD-10-CM | POA: Diagnosis not present

## 2016-08-27 DIAGNOSIS — L0211 Cutaneous abscess of neck: Secondary | ICD-10-CM | POA: Diagnosis not present

## 2016-08-27 DIAGNOSIS — E11622 Type 2 diabetes mellitus with other skin ulcer: Secondary | ICD-10-CM | POA: Diagnosis not present

## 2016-09-03 DIAGNOSIS — L98492 Non-pressure chronic ulcer of skin of other sites with fat layer exposed: Secondary | ICD-10-CM | POA: Diagnosis not present

## 2016-09-03 DIAGNOSIS — E11622 Type 2 diabetes mellitus with other skin ulcer: Secondary | ICD-10-CM | POA: Diagnosis not present

## 2016-09-03 DIAGNOSIS — M436 Torticollis: Secondary | ICD-10-CM | POA: Diagnosis not present

## 2016-09-03 DIAGNOSIS — L0211 Cutaneous abscess of neck: Secondary | ICD-10-CM | POA: Diagnosis not present

## 2016-09-03 LAB — GLUCOSE, CAPILLARY: Glucose-Capillary: 401 mg/dL — ABNORMAL HIGH (ref 65–99)

## 2016-09-10 ENCOUNTER — Encounter (HOSPITAL_BASED_OUTPATIENT_CLINIC_OR_DEPARTMENT_OTHER): Payer: Medicare Other | Attending: Surgery

## 2016-09-10 DIAGNOSIS — L98492 Non-pressure chronic ulcer of skin of other sites with fat layer exposed: Secondary | ICD-10-CM | POA: Diagnosis not present

## 2016-09-10 DIAGNOSIS — Y838 Other surgical procedures as the cause of abnormal reaction of the patient, or of later complication, without mention of misadventure at the time of the procedure: Secondary | ICD-10-CM | POA: Diagnosis not present

## 2016-09-10 DIAGNOSIS — L0211 Cutaneous abscess of neck: Secondary | ICD-10-CM | POA: Diagnosis not present

## 2016-09-10 DIAGNOSIS — T8189XA Other complications of procedures, not elsewhere classified, initial encounter: Secondary | ICD-10-CM | POA: Diagnosis not present

## 2016-09-10 DIAGNOSIS — E11622 Type 2 diabetes mellitus with other skin ulcer: Secondary | ICD-10-CM | POA: Diagnosis not present

## 2016-09-10 DIAGNOSIS — E119 Type 2 diabetes mellitus without complications: Secondary | ICD-10-CM | POA: Diagnosis not present

## 2016-09-17 DIAGNOSIS — L0211 Cutaneous abscess of neck: Secondary | ICD-10-CM | POA: Diagnosis not present

## 2016-09-17 DIAGNOSIS — L98492 Non-pressure chronic ulcer of skin of other sites with fat layer exposed: Secondary | ICD-10-CM | POA: Diagnosis not present

## 2016-09-17 DIAGNOSIS — E119 Type 2 diabetes mellitus without complications: Secondary | ICD-10-CM | POA: Diagnosis not present

## 2016-09-17 DIAGNOSIS — E11622 Type 2 diabetes mellitus with other skin ulcer: Secondary | ICD-10-CM | POA: Diagnosis not present

## 2016-09-17 DIAGNOSIS — T8189XA Other complications of procedures, not elsewhere classified, initial encounter: Secondary | ICD-10-CM | POA: Diagnosis not present

## 2016-09-24 DIAGNOSIS — T8189XA Other complications of procedures, not elsewhere classified, initial encounter: Secondary | ICD-10-CM | POA: Diagnosis not present

## 2016-09-24 DIAGNOSIS — L98499 Non-pressure chronic ulcer of skin of other sites with unspecified severity: Secondary | ICD-10-CM | POA: Diagnosis not present

## 2016-09-24 DIAGNOSIS — E11622 Type 2 diabetes mellitus with other skin ulcer: Secondary | ICD-10-CM | POA: Diagnosis not present

## 2016-09-24 DIAGNOSIS — L0211 Cutaneous abscess of neck: Secondary | ICD-10-CM | POA: Diagnosis not present

## 2016-09-24 DIAGNOSIS — L98492 Non-pressure chronic ulcer of skin of other sites with fat layer exposed: Secondary | ICD-10-CM | POA: Diagnosis not present

## 2016-09-24 DIAGNOSIS — E119 Type 2 diabetes mellitus without complications: Secondary | ICD-10-CM | POA: Diagnosis not present

## 2016-10-31 ENCOUNTER — Telehealth: Payer: Self-pay | Admitting: Internal Medicine

## 2016-10-31 NOTE — Telephone Encounter (Signed)
APT. REMINDER CALL, LMTCB °

## 2016-11-01 ENCOUNTER — Ambulatory Visit (INDEPENDENT_AMBULATORY_CARE_PROVIDER_SITE_OTHER): Payer: Medicare Other | Admitting: Internal Medicine

## 2016-11-01 ENCOUNTER — Encounter: Payer: Self-pay | Admitting: Internal Medicine

## 2016-11-01 VITALS — BP 107/74 | HR 88 | Temp 98.0°F | Ht 68.0 in | Wt 202.2 lb

## 2016-11-01 DIAGNOSIS — IMO0002 Reserved for concepts with insufficient information to code with codable children: Secondary | ICD-10-CM

## 2016-11-01 DIAGNOSIS — E118 Type 2 diabetes mellitus with unspecified complications: Principal | ICD-10-CM

## 2016-11-01 DIAGNOSIS — R195 Other fecal abnormalities: Secondary | ICD-10-CM | POA: Diagnosis not present

## 2016-11-01 DIAGNOSIS — Z794 Long term (current) use of insulin: Secondary | ICD-10-CM | POA: Diagnosis not present

## 2016-11-01 DIAGNOSIS — E1165 Type 2 diabetes mellitus with hyperglycemia: Secondary | ICD-10-CM | POA: Diagnosis not present

## 2016-11-01 LAB — POCT GLYCOSYLATED HEMOGLOBIN (HGB A1C): Hemoglobin A1C: 10.8

## 2016-11-01 LAB — GLUCOSE, CAPILLARY: Glucose-Capillary: 414 mg/dL — ABNORMAL HIGH (ref 65–99)

## 2016-11-01 MED ORDER — METFORMIN HCL 500 MG PO TABS: 500.0000 mg | ORAL_TABLET | Freq: Two times a day (BID) | ORAL | 0 refills | Status: DC

## 2016-11-01 NOTE — Progress Notes (Signed)
   CC: Diabetes management  HPI:  [name] is a 46 y.o. man here today for management of his uncontrolled diabetes.  See problem based assessment and plan below for additional details  Past Medical History:  Diagnosis Date  . Diabetes mellitus without complication (HCC)     Review of Systems:  Review of Systems  Eyes: Negative for blurred vision.  Respiratory: Negative for shortness of breath.   Cardiovascular: Negative for leg swelling.  Gastrointestinal:       Yellow colored stools for a week  Genitourinary: Positive for frequency.  Endo/Heme/Allergies: Positive for polydipsia.    Physical Exam: Physical Exam  Constitutional: He is well-developed, well-nourished, and in no distress.  HENT:  Mouth/Throat: Oropharynx is clear and moist.  Eyes: Conjunctivae are normal.  Cardiovascular: Normal rate and regular rhythm.   Pulmonary/Chest: Effort normal and breath sounds normal.  Abdominal: Soft. Bowel sounds are normal. There is no tenderness.  Musculoskeletal: He exhibits no edema.    Vitals:   11/01/16 1419  BP: 107/74  Pulse: 88  Temp: 98 F (36.7 C)  TempSrc: Oral  SpO2: 100%  Weight: 202 lb 3.2 oz (91.7 kg)  Height: 5\' 8"  (1.727 m)    Assessment & Plan:   See Encounters Tab for problem based charting.  Patient discussed with Dr. Heide SparkNarendra

## 2016-11-01 NOTE — Patient Instructions (Signed)
It was a pleasure to see you today Mr. Jimmy Castillo.  I would like to increase our treatment for your diabetes. Please resume taking your lantus insulin 20 units nightly. I have sent a new prescription for Metformin 500mg  twice daily as another medicine for your diabetes. This medicine works to help your body use its insulin more effectively.  Please continue checking blood sugars at home and come back in 2 weeks so we can see our progress. I expect more is needed but your fatigue and thirst will get better once we can improve these high sugars.

## 2016-11-02 LAB — MICROALBUMIN / CREATININE URINE RATIO: CREATININE, UR: 43 mg/dL

## 2016-11-04 ENCOUNTER — Other Ambulatory Visit: Payer: Self-pay

## 2016-11-04 DIAGNOSIS — R195 Other fecal abnormalities: Secondary | ICD-10-CM | POA: Insufficient documentation

## 2016-11-04 NOTE — Assessment & Plan Note (Signed)
HPI: Yellowish stools reported for about 3-4 days at home. He denies any significant GI complaints at all. He is recently getting over what sounds like a viral URI since about a week ago.  A: The most likely cause for acute change in stool color without any GI problems is malabsorption. His recent infection could cause this. There is also the possibility for a significant process behind this but if it resolves spontaneously within the next 2 weeks I would prefer to avoid extensive workup. If this persists it will need to be figure out.  P: No new intervention at this time, reassess in 2 week follow up

## 2016-11-04 NOTE — Progress Notes (Signed)
Internal Medicine Clinic Attending  Case discussed with Dr. Rice at the time of the visit.  We reviewed the resident's history and exam and pertinent patient test results.  I agree with the assessment, diagnosis, and plan of care documented in the resident's note.  

## 2016-11-04 NOTE — Assessment & Plan Note (Addendum)
HPI: Mr. Jimmy Castillo has been using lantus insulin at home and feels it is not helping as his sugars remain consistently high from about 200-400. He is symptomatic of this with headaches, fatigue, and polydipisia. Fortunately he has no visual complaints. From this conversation I also suspect his diet is not particularly good with regards to reducing simple starches. He reports previously taking metformin while living in [place] but stopped when he stopped taking all medications. He does not recall if there was any particular issue with the medicine.  A: Uncontrolled type 2 diabetes without known complications  P: -Strongly urged him to keep taking his insulin as directed -Start metformin 500mg  BID -Checking protein microalbumin to screen for nephropathy -RTC in about 2 weeks with glucometer data and continue titrating therapy If he becomes more compliant and is making progress we can spread out the follow up and allow some home management discretion

## 2016-11-04 NOTE — Telephone Encounter (Signed)
Lm for rtc 

## 2016-11-04 NOTE — Telephone Encounter (Signed)
Needs to speak with a nurse regarding metformin.

## 2016-11-06 NOTE — Telephone Encounter (Signed)
Finally got pt, he has diarrhea since sat 1/27, "gassy", abd bloating, states he has had low grade temp possibly. Started metformin Friday. cbg's 180's to 190's [facility]

## 2016-11-07 ENCOUNTER — Telehealth: Payer: Self-pay | Admitting: Internal Medicine

## 2016-11-07 NOTE — Telephone Encounter (Signed)
AT. REMINDER CALL, PHONE NOT IN SERVICE °

## 2016-11-08 ENCOUNTER — Ambulatory Visit (INDEPENDENT_AMBULATORY_CARE_PROVIDER_SITE_OTHER): Payer: Medicare Other | Admitting: Internal Medicine

## 2016-11-08 DIAGNOSIS — E1165 Type 2 diabetes mellitus with hyperglycemia: Secondary | ICD-10-CM

## 2016-11-08 DIAGNOSIS — E118 Type 2 diabetes mellitus with unspecified complications: Principal | ICD-10-CM

## 2016-11-08 DIAGNOSIS — IMO0002 Reserved for concepts with insufficient information to code with codable children: Secondary | ICD-10-CM

## 2016-11-08 DIAGNOSIS — R197 Diarrhea, unspecified: Secondary | ICD-10-CM | POA: Diagnosis not present

## 2016-11-08 DIAGNOSIS — E119 Type 2 diabetes mellitus without complications: Secondary | ICD-10-CM | POA: Diagnosis present

## 2016-11-08 DIAGNOSIS — Z794 Long term (current) use of insulin: Secondary | ICD-10-CM | POA: Diagnosis not present

## 2016-11-08 MED ORDER — METFORMIN HCL 500 MG PO TABS: 500.0000 mg | ORAL_TABLET | Freq: Every day | ORAL | 0 refills | Status: DC

## 2016-11-08 NOTE — Progress Notes (Signed)
   CC: diarrhea  HPI:  [name] is a 46 y.o. M with medical history as described below who presents for evaluation of diarrhea.   Diarrhea: He reports developing watery diarrhea 1/27, the day following starting Metformin 500 mg BID for type 2 diabetes. He describes the diarrhea as watery and soft stools. No blood or mucous. Denies any fevers, chills, nausea, vomiting however did endorse that his son was sick with similar symptoms just prior to onset. Denies new foods. Reports symptoms have resolved since taking Metformin 500 mg only once daily.   Type 2 Diabetes: As above. Has been on Lantus 20 units. Was started on Metformin 500 mg BID 1/26. Patient feels that his diabetes is not actually diabetes and must be due to some underlying condition. Asked "Do I look like the kind of person that has diabetes?"  Past Medical History:  Diagnosis Date  . Diabetes mellitus without complication (HCC)     Review of Systems:  Review of Systems  Constitutional: Negative for chills, fever and weight loss.  Respiratory: Negative for cough and shortness of breath.   Cardiovascular: Negative for chest pain and leg swelling.  Gastrointestinal: Negative for blood in stool, melena, nausea and vomiting.  Musculoskeletal: Negative for joint pain and myalgias.  Neurological: Negative for dizziness, weakness and headaches.   Physical Exam: Physical Exam  Constitutional: He is well-developed, well-nourished, and in no distress.  HENT:  Head: Normocephalic and atraumatic.  Cardiovascular: Normal rate and regular rhythm.   No murmur heard. Pulmonary/Chest: Effort normal and breath sounds normal. No respiratory distress. He has no wheezes.  Abdominal: Soft. Bowel sounds are normal. He exhibits no distension. There is no tenderness.  Musculoskeletal: He exhibits no edema.  Neurological: He is alert. Coordination normal.  Skin: Skin is warm and dry. He is not diaphoretic. No erythema.  Psychiatric: His mood  appears anxious. His affect is labile and inappropriate.  Scant eye-contact. Delusions that his diabetes must be due to something else as he "doesnt look like the type of person with diabetes." Also had paranoia about his pharmacy not giving him the right medications.    Vitals:   11/08/16 0928  BP: 119/74  Pulse: 83  Temp: 98.2 F (36.8 C)  TempSrc: Oral  SpO2: 99%  Weight: 199 lb 1.6 oz (90.3 kg)  Height: 5\' 8"  (1.727 m)    Assessment & Plan:   See Encounters Tab for problem based charting.  Patient discussed with Dr. Cleda DaubE. Hoffman

## 2016-11-08 NOTE — Assessment & Plan Note (Signed)
Patient started Metformin 500 mg BID and the following day developed diarrhea. Did have sick contact (son with diarrhea) however he denied any fevers, chills, nausea vomiting or abdominal pain. I suspect his diarrhea is secondary to starting metformin as his symptoms dissipated after only taking metformin once a day.  -Decreased metformin to 500 mg once daily -Offered extended release formula however patient declined  -Continue Lantus 20 units daily

## 2016-11-08 NOTE — Patient Instructions (Signed)
I'm sorry you are not feeling well. Metformin is well-known for causing diarrhea. I believe what caused your symptoms was the virus and starting metformin twice daily. For this, please continue taking the metformin once daily. We will need to slowly increase this medication so that you better tolerate it. We also discussed that there is an extended release form as well which has much less GI side effects. Please call the clinic if you change your mind about not wanting to start this medicine.  Please follow-up with Dr. Dimple Caseyice in 1 month regarding your chronic health issues and complaints, and to make sure your diarrhea has improved!

## 2016-11-12 ENCOUNTER — Telehealth: Payer: Self-pay | Admitting: Internal Medicine

## 2016-11-12 NOTE — Telephone Encounter (Signed)
PT CALLED WANTED TO APPLY FOR GCCN CARD, HE IS DISABLED AND HAS MEDICARE A & B COVERAGE. ADVISED PATIENT TO APPLY FOR MEDICAID COVERAGE.

## 2016-11-12 NOTE — Progress Notes (Signed)
Internal Medicine Clinic Attending  Case discussed with Dr. Molt at the time of the visit.  We reviewed the resident's history and exam and pertinent patient test results.  I agree with the assessment, diagnosis, and plan of care documented in the resident's note. 

## 2016-11-15 ENCOUNTER — Ambulatory Visit: Payer: Medicare Other

## 2016-11-29 ENCOUNTER — Encounter: Payer: Self-pay | Admitting: Pulmonary Disease

## 2016-11-29 ENCOUNTER — Ambulatory Visit (INDEPENDENT_AMBULATORY_CARE_PROVIDER_SITE_OTHER): Payer: Medicare Other | Admitting: Pulmonary Disease

## 2016-11-29 ENCOUNTER — Ambulatory Visit: Payer: Medicare Other

## 2016-11-29 VITALS — BP 113/76 | HR 86 | Temp 97.8°F | Ht 68.0 in | Wt 200.3 lb

## 2016-11-29 DIAGNOSIS — E1165 Type 2 diabetes mellitus with hyperglycemia: Secondary | ICD-10-CM

## 2016-11-29 DIAGNOSIS — Z794 Long term (current) use of insulin: Secondary | ICD-10-CM | POA: Diagnosis not present

## 2016-11-29 DIAGNOSIS — R197 Diarrhea, unspecified: Secondary | ICD-10-CM

## 2016-11-29 DIAGNOSIS — E118 Type 2 diabetes mellitus with unspecified complications: Principal | ICD-10-CM

## 2016-11-29 DIAGNOSIS — IMO0002 Reserved for concepts with insufficient information to code with codable children: Secondary | ICD-10-CM

## 2016-11-29 DIAGNOSIS — R404 Transient alteration of awareness: Secondary | ICD-10-CM | POA: Diagnosis not present

## 2016-11-29 MED ORDER — METFORMIN HCL ER 500 MG PO TB24: 500.0000 mg | ORAL_TABLET | Freq: Every day | ORAL | 1 refills | Status: DC

## 2016-11-29 MED ORDER — GLIPIZIDE 5 MG PO TABS: 5.0000 mg | ORAL_TABLET | Freq: Every day | ORAL | 1 refills | Status: DC

## 2016-11-29 NOTE — Progress Notes (Signed)
   CC: diabetes follow up  HPI:  [name] is a 46 y.o. man with diabetes type 2 here for follow up of his diabetes.  Diarrhea - occasional but greatly improved  Diabetes - One CBG at 173. Others have been in the 200s to 400s. No episodes of hypoglycemia.   He had one episode lasting 10-30 seconds - earlier this month. Does not syncopize. Occurred while driving. Felt like his vision got dark/blurry. No pain or discomfort anywhere. Did not have incontinence. No post ictal symptoms. Did not check blood sugar. Last happened two years ago. He has had traumatic experiences in the past - very vivid dreams.    Past Medical History:  Diagnosis Date  . Diabetes mellitus without complication (HCC)     Review of Systems:   No chest pain No dyspnea  Physical Exam:  Vitals:   11/29/16 0945  BP: 113/76  Pulse: 86  Temp: 97.8 F (36.6 C)  TempSrc: Oral  SpO2: 99%  Weight: 200 lb 4.8 oz (90.9 kg)  Height: 5\' 8"  (1.727 m)   General Apperance: NAD HEENT: Normocephalic, atraumatic, anicteric sclera Neck: Supple, trachea midline Lungs: Clear to auscultation bilaterally. No wheezes, rhonchi or rales. Breathing comfortably Heart: Regular rate and rhythm, no murmur/rub/gallop Abdomen: Soft, nontender, nondistended, no rebound/guarding Extremities: Warm and well perfused, no edema Skin: No rashes or lesions Neurologic: Alert and interactive. No gross deficits.  Assessment & Plan:   See Encounters Tab for problem based charting.  Patient discussed with Dr. Rogelia BogaButcher

## 2016-11-29 NOTE — Patient Instructions (Addendum)
Metformin: Increase by 1 tablet after 1-2 weeks until you reach 4 tablets by mouth daily.  Glipizide: Start taking one tablet a day before breakfast  May consider changing your Lantus to every morning if this helps you remember to take it.  Please do not drive until you see a neurologist. We will order a brain MRI for you and refer you to a neurologist

## 2016-11-29 NOTE — Assessment & Plan Note (Signed)
Assessment: Two episodes of altered level of consciousness without loss of body tone. No postictal symptoms. Has blurred/darkened vision just prior. Can occur while driving. Concern for focal seizures with impairment of consciousness or awareness.   Plan: Will start work up with MRI brain with and without contrast No driving until seen by neurology - patient agrees Referral to neurology

## 2016-11-29 NOTE — Assessment & Plan Note (Signed)
Assessment: CBG in the 200s mostly. He checks it twice a day. No hypoglycemia.  Plan: Switch metformin to XR and increase by 1 tablet every 1-2 weeks until goal of 2000mg  daily Start glipizide 5mg  daily Continue Lantus 20u daily Follow up in 4-6 weeks

## 2016-12-02 NOTE — Progress Notes (Signed)
Internal Medicine Clinic Attending  Case discussed with Dr. Krall soon after the resident saw the patient.  We reviewed the resident's history and exam and pertinent patient test results.  I agree with the assessment, diagnosis, and plan of care documented in the resident's note. 

## 2016-12-06 ENCOUNTER — Ambulatory Visit: Payer: Medicare Other

## 2016-12-09 ENCOUNTER — Ambulatory Visit (HOSPITAL_COMMUNITY)
Admission: RE | Admit: 2016-12-09 | Discharge: 2016-12-09 | Disposition: A | Discharge status: Home / Self Care | Payer: Medicare Other | Source: Ambulatory Visit | Attending: Internal Medicine | Admitting: Internal Medicine

## 2016-12-09 DIAGNOSIS — R404 Transient alteration of awareness: Secondary | ICD-10-CM | POA: Insufficient documentation

## 2016-12-09 DIAGNOSIS — H538 Other visual disturbances: Secondary | ICD-10-CM | POA: Diagnosis not present

## 2016-12-09 LAB — CREATININE, SERUM
CREATININE: 1.09 mg/dL (ref 0.61–1.24)
GFR calc non Af Amer: >60 mL/min (ref >60)

## 2016-12-09 MED ORDER — GADOBENATE DIMEGLUMINE 529 MG/ML IV SOLN
20.0000 mL | Freq: Once | INTRAVENOUS | Status: AC
  Administered 2016-12-09: 19 mL via INTRAVENOUS

## 2016-12-27 ENCOUNTER — Ambulatory Visit: Payer: Medicare Other

## 2016-12-27 ENCOUNTER — Telehealth: Payer: Self-pay | Admitting: *Deleted

## 2016-12-27 NOTE — Telephone Encounter (Signed)
Called pt - no answer; left message to call back for result.

## 2016-12-27 NOTE — Telephone Encounter (Signed)
-----   Message from Lora PaulaJennifer T Krall, MD sent at 12/24/2016 11:52 PM EDT ----- Regarding: MRI results Could you please let him know that his MRI was normal? And also remind him to keep the neurology appointment.   THANKS! Griffin BasilJennifer Krall

## 2016-12-30 ENCOUNTER — Telehealth: Payer: Self-pay | Admitting: Internal Medicine

## 2016-12-30 NOTE — Telephone Encounter (Signed)
APT. REMINDER CALL, LMTCB °

## 2016-12-31 ENCOUNTER — Encounter: Payer: Self-pay | Admitting: Dietician

## 2016-12-31 ENCOUNTER — Ambulatory Visit (INDEPENDENT_AMBULATORY_CARE_PROVIDER_SITE_OTHER): Payer: Medicare Other | Admitting: Internal Medicine

## 2016-12-31 ENCOUNTER — Encounter: Payer: Self-pay | Admitting: Internal Medicine

## 2016-12-31 ENCOUNTER — Other Ambulatory Visit: Payer: Self-pay | Admitting: Dietician

## 2016-12-31 ENCOUNTER — Ambulatory Visit (INDEPENDENT_AMBULATORY_CARE_PROVIDER_SITE_OTHER): Payer: Medicare Other | Admitting: Dietician

## 2016-12-31 ENCOUNTER — Telehealth: Payer: Self-pay | Admitting: Dietician

## 2016-12-31 VITALS — BP 120/79 | HR 91 | Temp 98.1°F | Ht 68.0 in | Wt 207.5 lb

## 2016-12-31 DIAGNOSIS — E118 Type 2 diabetes mellitus with unspecified complications: Principal | ICD-10-CM

## 2016-12-31 DIAGNOSIS — E1165 Type 2 diabetes mellitus with hyperglycemia: Secondary | ICD-10-CM | POA: Diagnosis present

## 2016-12-31 DIAGNOSIS — IMO0002 Reserved for concepts with insufficient information to code with codable children: Secondary | ICD-10-CM

## 2016-12-31 DIAGNOSIS — Z87820 Personal history of traumatic brain injury: Secondary | ICD-10-CM

## 2016-12-31 DIAGNOSIS — Z91120 Patient's intentional underdosing of medication regimen due to financial hardship: Secondary | ICD-10-CM

## 2016-12-31 DIAGNOSIS — Z794 Long term (current) use of insulin: Secondary | ICD-10-CM | POA: Diagnosis not present

## 2016-12-31 DIAGNOSIS — R404 Transient alteration of awareness: Secondary | ICD-10-CM | POA: Diagnosis not present

## 2016-12-31 DIAGNOSIS — Z7984 Long term (current) use of oral hypoglycemic drugs: Secondary | ICD-10-CM

## 2016-12-31 DIAGNOSIS — Z6838 Body mass index (BMI) 38.0-38.9, adult: Secondary | ICD-10-CM

## 2016-12-31 MED ORDER — METFORMIN HCL ER 500 MG PO TB24: 500.0000 mg | ORAL_TABLET | Freq: Every day | ORAL | 1 refills | Status: DC

## 2016-12-31 MED ORDER — LEVETIRACETAM 500 MG PO TABS
500.0000 mg | ORAL_TABLET | Freq: Two times a day (BID) | ORAL | 2 refills | Status: DC
Start: 2016-12-31 — End: 2017-01-28

## 2016-12-31 MED ORDER — INSULIN DETEMIR 100 UNIT/ML ~~LOC~~ SOLN: 10.0000 [IU] | Freq: Every day | SUBCUTANEOUS | 2 refills | Status: DC

## 2016-12-31 MED ORDER — "INSULIN SYRINGE-NEEDLE U-100 31G X 15/64"" 0.3 ML MISC": 3 refills | Status: DC

## 2016-12-31 MED ORDER — LANCETS 30G MISC: 5 refills | Status: AC

## 2016-12-31 MED ORDER — GLUCOSE BLOOD VI STRP: ORAL_STRIP | 5 refills | Status: AC

## 2016-12-31 MED FILL — levETIRAcetam 500 MG TABS: 500 | 30 days supply | Qty: 60 | Fill #0

## 2016-12-31 MED FILL — METFORMIN HCL ER 500 MG TAB: 500 | 20 days supply | Qty: 60 | Fill #0

## 2016-12-31 NOTE — Assessment & Plan Note (Signed)
MRI was negative. Continues to have staring episodes. This may be complex focal seizures. Has hx of head injury in the remote past. MRI was negative. Neuro appt not until April 30th.  With his on going episodes, I will start keppra 500mg  bid today pending neurology eval. This can be adjusted as appropriate in the future.

## 2016-12-31 NOTE — Progress Notes (Signed)
Internal Medicine Clinic Attending  Case discussed with Dr. Ahmed at the time of the visit.  We reviewed the resident's history and exam and pertinent patient test results.  I agree with the assessment, diagnosis, and plan of care documented in the resident's note. 

## 2016-12-31 NOTE — Progress Notes (Signed)
  Medical Nutrition Therapy:  Appt start time: 1000 end time:  1030. Visit # 1  Assessment:  Primary concerns today: glycemic control  He's had diabetes for 4 or more years. He had some diabetes training at [facility] and prefers to continue with training here. He wants help finding out why his blood sugars are not better controlled. He'd also like to know why he is having black out spells with dizziness. He reports some stomach upset but weight is stable and he is taking metformin 3 pills a day.  Preferred Learning Style: No preference indicated  Learning Readiness: Ready and Change in progress  ANTHROPOMETRICS: weight-207.5#, height-38", BMI**  Nausea and some Diarrhea, but more tolerable lately.   MEDICATIONS: has been out of insulin for past month, has only been taking glipizide and metformin BLOOD SUGAR:He has a [facility] Prime meter. His fasting blood sugar is  mostly >180, later in the day ~ 200 Mg/dl DIETARY INTAKE:   16-XW24-hr recall- not abel to finish this today, ran out of time:  B ( AM): chicken leg L ( PM): aldis or [facility] frozen   Usual physical activity: walks for 30 minutes when weather is nice. Has not been doing much but ADLs lately.   Estimated daily energy needs: 2070 calories ~250 g carbohydrates  Progress Towards Goal(s):  In progress.   Nutritional Diagnosis:  NB-1.1 Food and nutrition-related knowledge deficit As related to lack of sufficient prior diabetes training.  As evidenced by his report and verbalized lack of knowledge. .    Intervention:  Nutrition education about types of diabetes, diabetes treatment, effects of activity and food in blood sugars, how to read his meter download.. Coordination of care: assisted with prescriptions for insulin, syringes, testing supplies.   Teaching Method Utilized: Visual, Auditory, Hands on Handouts given during visit include: meter download Barriers to learning/adherence to lifestyle change: support, blackout spells, lack of  material resources  Demonstrated degree of understanding via:  Teach Back   Monitoring/Evaluation:  Dietary intake, exercise, meter, and body weight in 4 week(s).

## 2016-12-31 NOTE — Telephone Encounter (Signed)
Had coupon for Lantus pens last time he got them. He has been out of insulin for the past month. He'd like to be able to control his blood sugar without insulin. He says he has Medicare part D but does not have the card with him.  Called [facility] today, they do not have a Medicare Part D card on file for him, he has been using a discount card there.

## 2016-12-31 NOTE — Telephone Encounter (Signed)
Patient presented to office after going to [facility]. He could not access the hope fund because he has Medicare part D. Brett CanalesSteve, the pharmacist at [facility], says his Part D Plan is a Acup[employer]BS run through [employer].Mr. Jimmy Castillo does not have the card with him. A vial of Levemir insulin was $200.00 so he also ran for Lantus and that was also over $200.00. He suggested that Mr. Jimmy Castillo apply for extra help. I showed Mr. Jimmy Castillo how to do this today.Mr. Jimmy Castillo says he was denied Medicaid.  I also spoke with Dr. Tasia CatchingsAhmed who signed out a vial of Levemir which was given to Mr. Jimmy Castillo with the understanding that the sample was to get him by while he was working on getting extra help or a way to pay for his insulin. I told him to contact the office with questions or concerns and to bring his [employer] card to his next visit. ( If he cannot find it, told him to order a new one)

## 2016-12-31 NOTE — Assessment & Plan Note (Addendum)
Diabetes remains uncontrolled with sugar averaging in 200's at home. Has not been taking lantus.  - take metformin XR 500mg  2 tablets bid, cont glipizide 5 mg daily - sent script for Levemir 10 units (did not do 20 units which was his lantus dose as he may not need so much).  - f/up in 1 month for re-assessment. May not need glipizide if he is going to be on insulin.   Addendum: patient came back because levemir and lantus were both >$200 [facility]. [facility] not [facility] b/c he has Medicare D. We gave him 1 vial of levemir sample from our clinic. This should last him about 100 days as it has 1000 units total and he will be doing 10 units daily.

## 2016-12-31 NOTE — Patient Instructions (Addendum)
Eat more... 1. Whole grains: whole wheat cereal, crackers, bread, pasta, old fashioned oats & brown rice 2. Whole fruits-1 cup a day 3. Vegetables- 2-3 cups a day 4. Lowfat Protein: Chicken, Mal[place]key, lean cuts of beef and pork, fish 2-3 times a week 5. Nuts, Seeds and Soy:add walnuts to cereal, peanut butter sandwich  Eat Less... 1. Saturated & Transfats- mostly from red meat, high fat dairy like milk, ice cream, coffee creamer, snack foods like chips, pork rind, fried foods 2. Added Sugar: artifical sweeteners can help 3. Sodium:Use spices instead of salt, avoid salty foods like soup, crackers, chips, lunch meat, sausage, hot dogs  Avoid.... . High-fructose corn syrup and "other sugars" . Partially hydrogenated vegetable oils . Trans fatty acids . Nondairy coffee creamer   1- Suggest NO trans fats 2- Less than 10 gram saturated fats per day    (replace saturated with mono and polyunsaturated) 3- Fish at least 2-3 times a week 4- Aim for more than 30 grams fiber/day from beans, fruits, vegetables, whole grains and nuts.   I am not here the day you are coming to see the doctor in April.  Please make a follow up appointment with me in May on the same day you see the doctor.    Lupita LeashDonna Diabetes Educator [phone]

## 2016-12-31 NOTE — Progress Notes (Signed)
   CC: DM II follow up  HPI:  [name] is a 46 y.o. with pmh as listed below is here for DM II follow up.  hgba1c 11/01/16 was 10.8 (down from 12 from 07/2016). Last visit 2/23 he was switched to metformin XR due to diarrhea with instruction to go up every week to 2000mg  daily. Currently taking 1500mg  total of metformin daily.  Also started on glipizide 5 mg daily and continued on lantus 20 units daily (but he did not have lantus for more than 1 month due to the cost, [facility] and they said he did not give have Medicare D on file at the pharmacy). CBG reading ranging from 140-320, average around 200's. No hypoglycemias.   Had 2 episodes of AMS last visit and was referred to neurology. MRI was negative. Had another episode this Saturday where he was just staring and had a Deja vu episode of seeing the same car passing twice, no jerking activities. Not seen by neurology yet, has appt on April 30th. [place]. No weakness or any neuro deficits currently. No hx of seizures as a child, he has been having these episodes for last 2 years.    Past Medical History:  Diagnosis Date  . Diabetes mellitus without complication (HCC)     Review of Systems:   Review of Systems  Constitutional: Negative for chills and fever.  Cardiovascular: Negative for chest pain and palpitations.  Gastrointestinal: Negative for diarrhea, heartburn and nausea.  Genitourinary: Negative for dysuria.  Neurological: Positive for loss of consciousness. Negative for dizziness and headaches.     Physical Exam:  There were no vitals filed for this visit. Physical Exam  Constitutional: He is oriented to person, place, and time. He appears well-developed and well-nourished. No distress.  HENT:  Head: Normocephalic and atraumatic.  Mouth/Throat: No oropharyngeal exudate.  Eyes: Conjunctivae and EOM are normal. Pupils are equal, round, and reactive to light. Right eye exhibits no  discharge. Left eye exhibits no discharge.  Cardiovascular: Normal rate and regular rhythm.  Exam reveals no gallop and no friction rub.   No murmur heard. Respiratory: Effort normal and breath sounds normal. No respiratory distress. He has no wheezes.  Musculoskeletal: Normal range of motion. He exhibits no edema or tenderness.  Neurological: He is alert and oriented to person, place, and time. No cranial nerve deficit.  Skin: He is not diaphoretic.  Psychiatric: He has a normal mood and affect.    Assessment & Plan:   See Encounters Tab for problem based charting.  Patient discussed with Dr. Rogelia BogaButcher

## 2016-12-31 NOTE — Patient Instructions (Signed)
[facility] to pick [facility] your medications.  Lets have you tale Levemir (similar to lantus) 10 units at night.  Continue metformin 500mg  tablets 2 tablets twice a day and glipizide for your diabetes.  Start taking keppra 500mg  one tablet twice a day for your possible seizures.  Follow up with neurology  Follow up with our clinic in 1 month.

## 2017-01-08 NOTE — Telephone Encounter (Signed)
Pt was seen 12/31/16 by Dr Tasia Catchings.

## 2017-01-16 ENCOUNTER — Other Ambulatory Visit: Payer: Self-pay | Admitting: *Deleted

## 2017-01-16 ENCOUNTER — Encounter: Payer: Self-pay | Admitting: Pharmacist

## 2017-01-16 NOTE — Telephone Encounter (Signed)
Tried contacting patient x 3, unable to reach, left message.

## 2017-01-16 NOTE — Telephone Encounter (Signed)
I think he may respond better with electronic/Mychart communication.  This is his preferred communication method per his chart.

## 2017-01-17 MED ORDER — GLIPIZIDE 5 MG PO TABS: 5.0000 mg | ORAL_TABLET | Freq: Every day | ORAL | 1 refills | Status: DC

## 2017-01-17 MED FILL — METFORMIN HCL ER 500 MG TAB: 500 | 15 days supply | Qty: 60 | Fill #1

## 2017-01-27 ENCOUNTER — Telehealth: Payer: Self-pay | Admitting: Internal Medicine

## 2017-01-27 NOTE — Telephone Encounter (Signed)
APT. REMINDER CALL, LMTCB °

## 2017-01-28 ENCOUNTER — Ambulatory Visit (INDEPENDENT_AMBULATORY_CARE_PROVIDER_SITE_OTHER): Payer: Medicare Other | Admitting: Internal Medicine

## 2017-01-28 VITALS — BP 117/84 | HR 81 | Temp 97.7°F | Ht 68.0 in | Wt 207.2 lb

## 2017-01-28 DIAGNOSIS — IMO0002 Reserved for concepts with insufficient information to code with codable children: Secondary | ICD-10-CM

## 2017-01-28 DIAGNOSIS — Z794 Long term (current) use of insulin: Secondary | ICD-10-CM | POA: Diagnosis not present

## 2017-01-28 DIAGNOSIS — E1165 Type 2 diabetes mellitus with hyperglycemia: Secondary | ICD-10-CM | POA: Diagnosis present

## 2017-01-28 DIAGNOSIS — E118 Type 2 diabetes mellitus with unspecified complications: Principal | ICD-10-CM

## 2017-01-28 LAB — POCT GLYCOSYLATED HEMOGLOBIN (HGB A1C): Hemoglobin A1C: 7.6

## 2017-01-28 LAB — GLUCOSE, CAPILLARY: GLUCOSE-CAPILLARY: 117 mg/dL — AB (ref 65–99)

## 2017-01-28 MED ORDER — METFORMIN HCL ER 500 MG PO TB24: 1000.0000 mg | ORAL_TABLET | Freq: Two times a day (BID) | ORAL | 1 refills | Status: DC

## 2017-01-28 MED ORDER — GLIPIZIDE 10 MG PO TABS: 10.0000 mg | ORAL_TABLET | Freq: Every day | ORAL | 2 refills | Status: DC

## 2017-01-28 MED FILL — glipiZIDE 10 MG TABS: 10 | 30 days supply | Qty: 30 | Fill #0

## 2017-01-28 NOTE — Assessment & Plan Note (Signed)
Lab Results  Component Value Date   HGBA1C 7.6 01/28/2017   HGBA1C 10.8 11/01/2016   HGBA1C 12.0 (H) 07/19/2016    A1c today is much improved from prior; 10.8 > 7.6 today. He is currently on metfromin XR 1000 mg bid, glipizide 5 mg daily and Levemir 10 units qhs (started at 12/31/16 visit). Unfortunately, the patient reports that the cost of the Levemir or Lantus at even the [facility] pharmacy was >$200. He was given a vial at his last visit. He reports compliance with the medication but is frustrated about the cost. He did bring his meter with him today which shows that since starting the Levemir his CBGs have averaged from 150-200. No evidence of hypoglycemia from his meter. He reports occasional episodes of hypoglycemia that he describes as abdominal pain and discomfort followed by diarrhea. He has not checked his CBG during these events. No weakness, fatigue, diaphoresis, vision changes, shaking during these events.   Discussed with Dr. Selena Batten today. Appears that we will not be able to get his insurance to cover the insulin at this time. She is looking into assistance programs to try to get other medications available for him.   Assessment: DM type II, improving  Plan: He does not wish to continue the Levemir due to cost considerations currently. Will D/C today. Schedule follow up with Dr. Selena Batten to look into other options.  Increase Glipizide to 10 mg daily and continue the Metformin 1000 mg bid.  RTC in 1 month for follow up.

## 2017-01-28 NOTE — Progress Notes (Signed)
   CC: DM Follow Up  HPI:  [name] is a 46 y.o. male with a past medical history listed below here today for follow up of his DM type II.   For details of today's visit and the status of his chronic medical issues please refer to the assessment and plan.   Past Medical History:  Diagnosis Date  . Diabetes mellitus without complication (HCC)     Review of Systems:   See HPI  Physical Exam:  Vitals:   01/28/17 1045  BP: 117/84  Pulse: 81  Temp: 97.7 F (36.5 C)  TempSrc: Oral  SpO2: 99%  Weight: 207 lb 3.2 oz (94 kg)  Height:  (1.727 m)   Physical Exam  Constitutional: He is well-developed, well-nourished, and in no distress. No distress.  Cardiovascular: Normal rate and regular rhythm.   Pulmonary/Chest: Effort normal and breath sounds normal.  Abdominal: Soft. Bowel sounds are normal. There is tenderness in the right lower quadrant.     Assessment & Plan:   See Encounters Tab for problem based charting.  Patient discussed with Dr. Oswaldo Done

## 2017-01-28 NOTE — Progress Notes (Deleted)
   CC: ***  HPI:  Mr.Ben Delillo is a 46 y.o.   Past Medical History:  Diagnosis Date  . Diabetes mellitus without complication (HCC)     Review of Systems:  ***  Physical Exam:  Vitals:   01/28/17 1045  BP: 117/84  Pulse: 81  Temp: 97.7 F (36.5 C)  TempSrc: Oral  SpO2: 99%  Weight: 207 lb 3.2 oz (94 kg)  Height:  (1.727 m)   ***  Assessment & Plan:   See Encounters Tab for problem based charting.  Patient {GC/GE:3044014::"discussed with","seen with"} Dr. {NAMES:[id number]::"Butcher","Granfortuna","E. Hoffman","Klima","Mullen","Narendra","Vincent"}

## 2017-01-28 NOTE — Patient Instructions (Addendum)
Jimmy Castillo,  Please stop the levemir and continue the metformin and glipizide.  Please schedule an appointment to meet with Dr. Maudie Mercury for medication management.  Please follow up in clinic in 1 month and schedule an appointment to meet with Dr. Benjamine Mola in June as well.   Hypoglycemia  Hypoglycemia is when the sugar (glucose) level in the blood is too low. Symptoms of low blood sugar may include:  Feeling:  Hungry.  Worried or nervous (anxious).  Sweaty and clammy.  Confused.  Dizzy.  Sleepy.  Sick to your stomach (nauseous).  Having:  A fast heartbeat.  A headache.  A change in your vision.  Jerky movements that you cannot control (seizure).  Nightmares.  Tingling or no feeling (numbness) around the mouth, lips, or tongue.  Having trouble with:  Talking.  Paying attention (concentrating).  Moving (coordination).  Sleeping.  Shaking.  Passing out (fainting).  Getting upset easily (irritability). Low blood sugar can happen to people who have diabetes and people who do not have diabetes. Low blood sugar can happen quickly, and it can be an emergency. Treating Low Blood Sugar  Low blood sugar is often treated by eating or drinking something sugary right away. If you can think clearly and swallow safely, follow the 15:15 rule:  Take 15 grams of a fast-acting carb (carbohydrate). Some fast-acting carbs are:  1 tube of glucose gel.  3 sugar tablets (glucose pills).  6-8 pieces of hard candy.  4 oz (120 mL) of fruit juice.  4 oz (120 mL) of regular (not diet) soda.  Check your blood sugar 15 minutes after you take the carb.  If your blood sugar is still at or below 70 mg/dL (3.9 mmol/L), take 15 grams of a carb again.  If your blood sugar does not go above 70 mg/dL (3.9 mmol/L) after 3 tries, get help right away.  After your blood sugar goes back to normal, eat a meal or a snack within 1 hour. Treating Very Low Blood Sugar  If your blood sugar is at  or below 54 mg/dL (3 mmol/L), you have very low blood sugar (severe hypoglycemia). This is an emergency. Do not wait to see if the symptoms will go away. Get medical help right away. Call your local emergency services (911 in the [place] Do not drive yourself to the hospital. If you have very low blood sugar and you cannot eat or drink, you may need a glucagon shot (injection). A family member or friend should learn how to check your blood sugar and how to give you a glucagon shot. Ask your doctor if you need to have a glucagon shot kit at home. Follow these instructions at home: General instructions   Avoid any diets that cause you to not eat enough food. Talk with your doctor before you start any new diet.  Take over-the-counter and prescription medicines only as told by your doctor.  Limit alcohol to no more than 1 drink per day for nonpregnant women and 2 drinks per day for men. One drink equals 12 oz of beer, 5 oz of wine, or 1 oz of hard liquor.  Keep all follow-up visits as told by your doctor. This is important. If You Have Diabetes:    Make sure you know the symptoms of low blood sugar.  Always keep a source of sugar with you, such as:  Sugar.  Sugar tablets.  Glucose gel.  Fruit juice.  Regular soda (not diet soda).  Milk.  Hard  candy.  Honey.  Take your medicines as told.  Follow your exercise and meal plan.  Eat on time. Do not skip meals.  Follow your sick day plan when you cannot eat or drink normally. Make this plan ahead of time with your doctor.  Check your blood sugar as often as told by your doctor. Always check before and after exercise.  Share your diabetes care plan with:  Your work or school.  People you live with.  Check your pee (urine) for ketones:  When you are sick.  As told by your doctor.  Carry a card or wear jewelry that says you have diabetes. If You Have Low Blood Sugar From Other Causes:    Check your blood sugar as often  as told by your doctor.  Follow instructions from your doctor about what you cannot eat or drink. Contact a doctor if:  You have trouble keeping your blood sugar in your target range.  You have low blood sugar often. Get help right away if:  You still have symptoms after you eat or drink something sugary.  Your blood sugar is at or below 54 mg/dL (3 mmol/L).  You have jerky movements that you cannot control.  You pass out. These symptoms may be an emergency. Do not wait to see if the symptoms will go away. Get medical help right away. Call your local emergency services (911 in the [place] Do not drive yourself to the hospital. This information is not intended to replace advice given to you by your health care provider. Make sure you discuss any questions you have with your health care provider. Document Released: 12/18/2009 Document Revised: 02/29/2016 Document Reviewed: 10/27/2015 Elsevier Interactive Patient Education  2017 [employer].

## 2017-01-29 NOTE — Progress Notes (Signed)
Internal Medicine Clinic Attending  Case discussed with Dr. Boswell at the time of the visit.  We reviewed the resident's history and exam and pertinent patient test results.  I agree with the assessment, diagnosis, and plan of care documented in the resident's note.  

## 2017-02-03 ENCOUNTER — Ambulatory Visit (INDEPENDENT_AMBULATORY_CARE_PROVIDER_SITE_OTHER): Payer: Medicare Other | Admitting: Neurology

## 2017-02-03 ENCOUNTER — Encounter: Payer: Self-pay | Admitting: Neurology

## 2017-02-03 VITALS — BP 138/90 | HR 78 | Ht 68.0 in | Wt 210.0 lb

## 2017-02-03 DIAGNOSIS — R404 Transient alteration of awareness: Secondary | ICD-10-CM | POA: Diagnosis not present

## 2017-02-03 MED FILL — METFORMIN HCL ER 500 MG TAB: 500 | 30 days supply | Qty: 120 | Fill #0

## 2017-02-03 NOTE — Patient Instructions (Signed)
1.  We will check a sleep-deprived EEG.  If that is unrevealing, we will check a 24 hour ambulatory EEG 2.  No driving 3.  Follow up after testing.
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
## 2017-02-05 ENCOUNTER — Ambulatory Visit (INDEPENDENT_AMBULATORY_CARE_PROVIDER_SITE_OTHER): Payer: Medicare Other | Admitting: Neurology

## 2017-02-05 DIAGNOSIS — R404 Transient alteration of awareness: Secondary | ICD-10-CM

## 2017-02-07 ENCOUNTER — Other Ambulatory Visit: Payer: Self-pay

## 2017-02-07 DIAGNOSIS — R404 Transient alteration of awareness: Secondary | ICD-10-CM

## 2017-02-07 NOTE — Progress Notes (Signed)
ONE HOUR SLEEP-DEPRIVED ELECTROENCEPHALOGRAM REPORT  Date of Study: 02/06/2017  Patient's Name: Jimmy Castillo MRN: [id number] Date of Birth: Mar 17, 1971  Clinical History: 46 year old man with recurrent episodes of altered sensorium and awareness, such as "blanking out" and visual disturbance.  Medications: Glucophage Clucotrol Advil, Motrin CBD oil  Technical Summary: A multichannel digital EEG recording measured by the international 10-20 system with electrodes applied with paste and impedances below 5000 ohms performed in our laboratory with EKG monitoring in an awake and asleep patient.  Hyperventilation and photic stimulation were performed.  The digital EEG was referentially recorded, reformatted, and digitally filtered in a variety of bipolar and referential montages for optimal display.    Description: The patient is awake and asleep during the recording.  During maximal wakefulness, there is a symmetric, medium voltage 11 Hz posterior dominant rhythm that attenuates with eye opening.  The record is symmetric.  During drowsiness and sleep, there is an increase in theta slowing of the background.  Vertex waves and symmetric sleep spindles were seen.  Hyperventilation and photic stimulation did not elicit any abnormalities.  There were no epileptiform discharges or electrographic seizures seen.    EKG lead was unremarkable.  Impression: This 1-hour sleep-deprived awake and asleep EEG is normal.    Clinical Correlation: A normal EEG does not exclude a clinical diagnosis of epilepsy.  If further clinical questions remain, prolonged EEG may be helpful.  Clinical correlation is advised.   Shon MilletAdam Kaison Mcparland, DO

## 2017-02-10 ENCOUNTER — Telehealth: Payer: Self-pay | Admitting: Neurology

## 2017-02-10 NOTE — Telephone Encounter (Signed)
Caller: Arlys JohnBrian  Urgent? No  Reason for the call: Patient said he received a phone call regarding results. Thanks

## 2017-02-10 NOTE — Telephone Encounter (Signed)
Informed patient of normal EEG results. 

## 2017-02-13 ENCOUNTER — Encounter: Payer: Self-pay | Admitting: Family Medicine

## 2017-02-13 ENCOUNTER — Ambulatory Visit (INDEPENDENT_AMBULATORY_CARE_PROVIDER_SITE_OTHER): Payer: Medicare Other | Admitting: Family Medicine

## 2017-02-13 VITALS — BP 142/78 | HR 85 | Temp 98.1°F | Resp 12 | Ht 68.0 in | Wt 207.5 lb

## 2017-02-13 DIAGNOSIS — K219 Gastro-esophageal reflux disease without esophagitis: Secondary | ICD-10-CM | POA: Diagnosis not present

## 2017-02-13 DIAGNOSIS — M545 Low back pain, unspecified: Secondary | ICD-10-CM | POA: Insufficient documentation

## 2017-02-13 DIAGNOSIS — N50819 Testicular pain, unspecified: Secondary | ICD-10-CM

## 2017-02-13 DIAGNOSIS — E1149 Type 2 diabetes mellitus with other diabetic neurological complication: Secondary | ICD-10-CM | POA: Diagnosis not present

## 2017-02-13 DIAGNOSIS — M5442 Lumbago with sciatica, left side: Secondary | ICD-10-CM | POA: Diagnosis not present

## 2017-02-13 DIAGNOSIS — M5441 Lumbago with sciatica, right side: Secondary | ICD-10-CM | POA: Diagnosis not present

## 2017-02-13 DIAGNOSIS — R030 Elevated blood-pressure reading, without diagnosis of hypertension: Secondary | ICD-10-CM

## 2017-02-13 DIAGNOSIS — G8929 Other chronic pain: Secondary | ICD-10-CM | POA: Diagnosis not present

## 2017-02-13 DIAGNOSIS — R197 Diarrhea, unspecified: Secondary | ICD-10-CM

## 2017-02-13 LAB — CBC WITH DIFFERENTIAL/PLATELET
Basophils Absolute: 0 10*3/uL (ref 0.0–0.1)
Basophils Relative: 0.4 % (ref 0.0–3.0)
EOS PCT: 2 % (ref 0.0–5.0)
Eosinophils Absolute: 0.1 10*3/uL (ref 0.0–0.7)
HCT: 45.6 % (ref 39.0–52.0)
HEMOGLOBIN: 15.8 g/dL (ref 13.0–17.0)
LYMPHS ABS: 2.2 10*3/uL (ref 0.7–4.0)
Lymphocytes Relative: 34.5 % (ref 12.0–46.0)
MCHC: 34.8 g/dL (ref 30.0–36.0)
MCV: 86.6 fl (ref 78.0–100.0)
MONOS PCT: 7.6 % (ref 3.0–12.0)
Monocytes Absolute: 0.5 10*3/uL (ref 0.1–1.0)
Neutro Abs: 3.5 10*3/uL (ref 1.4–7.7)
Neutrophils Relative %: 55.5 % (ref 43.0–77.0)
Platelets: 245 10*3/uL (ref 150.0–400.0)
RBC: 5.26 Mil/uL (ref 4.22–5.81)
RDW: 13.1 % (ref 11.5–15.5)
WBC: 6.3 10*3/uL (ref 4.0–10.5)

## 2017-02-13 LAB — COMPREHENSIVE METABOLIC PANEL
ALK PHOS: 48 U/L (ref 39–117)
ALT: 27 U/L (ref 0–53)
AST: 21 U/L (ref 0–37)
Albumin: 4.6 g/dL (ref 3.5–5.2)
BILIRUBIN TOTAL: 0.6 mg/dL (ref 0.2–1.2)
BUN: 19 mg/dL (ref 6–23)
CALCIUM: 9.8 mg/dL (ref 8.4–10.5)
CO2: 26 meq/L (ref 19–32)
CREATININE: 1.07 mg/dL (ref 0.40–1.50)
Chloride: 102 mEq/L (ref 96–112)
GFR: 78.55 mL/min (ref >60.00)
GLUCOSE: 188 mg/dL — AB (ref 70–99)
Potassium: 4.3 mEq/L (ref 3.5–5.1)
Sodium: 137 mEq/L (ref 135–145)
TOTAL PROTEIN: 7.2 g/dL (ref 6.0–8.3)

## 2017-02-13 LAB — TSH: TSH: 3.23 u[IU]/mL (ref 0.35–4.50)

## 2017-02-13 LAB — URINALYSIS, ROUTINE W REFLEX MICROSCOPIC
Bilirubin Urine: NEGATIVE
HGB URINE DIPSTICK: NEGATIVE
Ketones, ur: NEGATIVE
LEUKOCYTES UA: NEGATIVE
NITRITE: NEGATIVE
SPECIFIC GRAVITY, URINE: 1.025 (ref 1.000–1.030)
Total Protein, Urine: NEGATIVE
Urine Glucose: NEGATIVE
Urobilinogen, UA: 0.2 (ref 0.0–1.0)
pH: 5.5 (ref 5.0–8.0)

## 2017-02-13 LAB — MICROALBUMIN / CREATININE URINE RATIO
CREATININE, U: 122.6 mg/dL
Microalb Creat Ratio: 1.1 mg/g (ref 0.0–30.0)
Microalb, Ur: 1.3 mg/dL (ref 0.0–1.9)

## 2017-02-13 MED ORDER — BACLOFEN 10 MG PO TABS: 10.0000 mg | ORAL_TABLET | Freq: Three times a day (TID) | ORAL | 1 refills | Status: DC | PRN

## 2017-02-13 MED ORDER — OMEPRAZOLE 40 MG PO CPDR: 40.0000 mg | DELAYED_RELEASE_CAPSULE | Freq: Every day | ORAL | 1 refills | Status: DC

## 2017-02-13 MED FILL — BACLOFEN 10 MG TABLET: 10 | 10 days supply | Qty: 30 | Fill #0

## 2017-02-13 MED FILL — OMEPRAZOLE DR 40 MG CAPSULE: 40 | 30 days supply | Qty: 30 | Fill #0
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
## 2017-02-13 NOTE — Patient Instructions (Addendum)
WE NOW OFFER   [facility] FAST TRACK!!!  SAME DAY Appointments for ACUTE CARE  Such as: Sprains, Injuries, cuts, abrasions, rashes, muscle pain, joint pain, back pain Colds, flu, sore throats, headache, allergies, cough, fever  Ear pain, sinus and eye infections Abdominal pain, nausea, vomiting, diarrhea, upset stomach Animal/insect bites  3 Easy Ways to Schedule: Walk-In Scheduling Call in scheduling Mychart Sign-up: [email or web address]     A few things to remember from today's visit:   Type 2 diabetes mellitus with neurological complications (HCC) - Plan: Comprehensive metabolic panel, Microalbumin / creatinine urine ratio  Chronic bilateral low back pain with bilateral sciatica - Plan: baclofen (LIORESAL) 10 MG tablet, MR Lumbar Spine Wo Contrast, Ambulatory referral to Orthopedic Surgery  Elevated blood pressure reading  Diarrhea, unspecified type - Plan: Comprehensive metabolic panel, CBC with Differential/Platelet, TSH  Gastroesophageal reflux disease, esophagitis presence not specified - Plan: omeprazole (PRILOSEC) 40 MG capsule  Testicular pain, unspecified - Plan: Urinalysis, Routine w reflex microscopic, MR Lumbar Spine Wo Contrast    Avoid foods that make your symptoms worse, for example coffee, chocolate,pepermeint,alcohol, and greasy food. Raising the head of your bed about 6 inches may help with nocturnal symptoms.  Avoid tobacco use. Weight loss (if you are overweight). Avoid lying down for 3 hours after eating.  Instead 3 large meals daily try small and more frequent meals during the day.  Every medication have side effects and medications for GERD are not the exception.At this time I think benefit is greater than risk.  There has been some concerns about dementia and medications like Omeprazole or Nexium (PPI) but recent studies do not show a relation. Also kidney function can be affected among some patients that take these type  of medications, we will follow accordingly. Taking these medications for long term could increase risk of osteoporosis (some debate now), vitamin deficiencies (Vit D and B12 specialty), increases risk of pneumonia.  You should be evaluated immediately if bloody vomiting, bloody stools, black stools (like tar), difficulty swallowing, food gets stuck on the way down or choking when eating. Abnormal weight loss or severe abdominal pain.  If symptoms are not resolved sometimes endoscopy is necessary.   Baclofen can cause dizziness/drowsy.  Local heat on back, OTC Icy hot.  Eye exam needed. Stop Ibuprofen.   Please be sure medication list is accurate. If a new problem present, please set up appointment sooner than planned today.

## 2017-02-15 ENCOUNTER — Encounter: Payer: Self-pay | Admitting: Family Medicine

## 2017-02-24 ENCOUNTER — Encounter: Payer: Self-pay | Admitting: Family Medicine

## 2017-02-25 MED FILL — glipiZIDE 10 MG TABS: 10 | 30 days supply | Qty: 30 | Fill #1

## 2017-02-26 ENCOUNTER — Ambulatory Visit (INDEPENDENT_AMBULATORY_CARE_PROVIDER_SITE_OTHER): Payer: Medicare Other | Admitting: Neurology

## 2017-02-26 DIAGNOSIS — R404 Transient alteration of awareness: Secondary | ICD-10-CM | POA: Diagnosis not present

## 2017-02-27 NOTE — Telephone Encounter (Signed)
Dr. Jordan - Please advise. Thanks! 

## 2017-03-04 NOTE — Progress Notes (Signed)
ELECTROENCEPHALOGRAM REPORT  Dates of Recording: 02/25/2017 at 1:43:26 PM to 02/26/2017 at 1:09:26 PM  [facility]Brian [facility]: [id number] Date of Birth: 02-07-1971  Procedure: 24-hour ambulatory EEG  History: 46 year old man with recurrent episodes of altered sensorium and awareness.  Medications:  Glucophage Clucotrol Advil, Motrin CBD oil  Technical Summary: This is a 24-hour multichannel digital EEG recording measured by the international 10-20 system with electrodes applied with paste and impedances below 5000 ohms performed as portable with EKG monitoring.  The digital EEG was referentially recorded, reformatted, and digitally filtered in a variety of bipolar and referential montages for optimal display.    DESCRIPTION OF RECORDING: During maximal wakefulness, the background activity consisted of a symmetric 10Hz  posterior dominant rhythm which was reactive to eye opening.  There were no epileptiform discharges or focal slowing seen in wakefulness.  During the recording, the patient progresses through wakefulness, drowsiness, and Stage 2 sleep.  Again, there were no epileptiform discharges seen.  Events:  There were no electrographic seizures seen.  EKG lead was unremarkable.  IMPRESSION: This 24-hour ambulatory EEG study is normal.    CLINICAL CORRELATION: A normal EEG does not exclude a clinical diagnosis of epilepsy.  If further clinical questions remain, inpatient video EEG monitoring may be helpful.   Shon MilletAdam Jaffe, DO

## 2017-03-05 MED FILL — METFORMIN HCL ER 500 MG TAB: 500 | 30 days supply | Qty: 120 | Fill #1

## 2017-03-06 ENCOUNTER — Ambulatory Visit (INDEPENDENT_AMBULATORY_CARE_PROVIDER_SITE_OTHER): Payer: Medicare Other

## 2017-03-06 ENCOUNTER — Encounter (INDEPENDENT_AMBULATORY_CARE_PROVIDER_SITE_OTHER): Payer: Self-pay | Admitting: Orthopedic Surgery

## 2017-03-06 ENCOUNTER — Ambulatory Visit (INDEPENDENT_AMBULATORY_CARE_PROVIDER_SITE_OTHER): Payer: Medicare Other | Admitting: Orthopedic Surgery

## 2017-03-06 DIAGNOSIS — G8929 Other chronic pain: Secondary | ICD-10-CM

## 2017-03-06 DIAGNOSIS — M5442 Lumbago with sciatica, left side: Secondary | ICD-10-CM
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
## 2017-03-10 ENCOUNTER — Ambulatory Visit
Admission: RE | Admit: 2017-03-10 | Discharge: 2017-03-10 | Disposition: A | Discharge status: Home / Self Care | Payer: Medicare Other | Source: Ambulatory Visit | Attending: Family Medicine | Admitting: Family Medicine

## 2017-03-10 DIAGNOSIS — M5441 Lumbago with sciatica, right side: Principal | ICD-10-CM

## 2017-03-10 DIAGNOSIS — N50819 Testicular pain, unspecified: Secondary | ICD-10-CM

## 2017-03-10 DIAGNOSIS — M47816 Spondylosis without myelopathy or radiculopathy, lumbar region: Secondary | ICD-10-CM | POA: Diagnosis not present

## 2017-03-10 DIAGNOSIS — G8929 Other chronic pain: Secondary | ICD-10-CM

## 2017-03-10 DIAGNOSIS — M5442 Lumbago with sciatica, left side: Principal | ICD-10-CM

## 2017-03-11 ENCOUNTER — Encounter: Payer: Self-pay | Admitting: Family Medicine

## 2017-03-12 ENCOUNTER — Ambulatory Visit: Payer: Medicare Other | Attending: Family Medicine | Admitting: Physical Therapy

## 2017-03-12 ENCOUNTER — Encounter: Payer: Self-pay | Admitting: Physical Therapy

## 2017-03-12 DIAGNOSIS — G8929 Other chronic pain: Secondary | ICD-10-CM | POA: Diagnosis not present

## 2017-03-12 DIAGNOSIS — M5442 Lumbago with sciatica, left side: Secondary | ICD-10-CM | POA: Insufficient documentation

## 2017-03-12 DIAGNOSIS — M62830 Muscle spasm of back: Secondary | ICD-10-CM | POA: Diagnosis not present

## 2017-03-12 DIAGNOSIS — R262 Difficulty in walking, not elsewhere classified: Secondary | ICD-10-CM | POA: Diagnosis not present

## 2017-03-13 ENCOUNTER — Encounter: Payer: Self-pay | Admitting: Physical Therapy

## 2017-03-13 NOTE — Therapy (Signed)
[facility] [address] [place], [place], [address] Phone: [phone]   Fax:  [phone]  Physical Therapy Evaluation  Patient Details  Name: [facility]Brian [facility] MRN: [id number] Date of Birth: 12/17/1970 Referring Provider: Dr Cammy CopaGregory Scott Dean  Encounter Date: 03/12/2017      PT End of Session - 03/13/17 1120    Visit Number 1   Number of Visits 16   Date for PT Re-Evaluation 05/08/17   Authorization Type Medicare    PT Start Time 1500   PT Stop Time 1542   PT Time Calculation (min) 42 min   Activity Tolerance Patient tolerated treatment well   Behavior [facility] for tasks assessed/performed      Past Medical History:  Diagnosis Date  . Allergy   . Diabetes mellitus without complication [facility](HCC)     Past Surgical History:  Procedure Laterality Date  . INCISION AND DRAINAGE OF WOUND Left 07/23/2016   Procedure: IRRIGATION AND DEBRIDEMENT LEFT NECK ABSCESS;  Surgeon: Ovidio Kinavid Newman, MD;  [facility];  Service: General;  Laterality: Left;  . WISDOM TOOTH EXTRACTION      There were no vitals filed for this visit.       Subjective Assessment - 03/12/17 1510    Subjective Patient presents with bilateral lower back pain with pain going into his left hip. His pain increases with standing. He has improved pain when walking and with certain sitting positions. He has a golf club he uses mostly so people will not bump into him. He likes to walk and walks most places.    Limitations Standing;Sitting;House hold activities   How long can you sit comfortably? Depends on the position    How long can you stand comfortably? Can not stand for more then a couple of minutes   How long can you walk comfortably? walking is not a problem    Diagnostic tests Lumbar MRI: L4-L5 disc herniation with annular fissure    Patient Stated Goals To have less pain    Currently in Pain? Yes   Pain Score 8    Pain Location Shoulder    Pain Orientation Right   Pain Descriptors / Indicators Aching   Pain Type Acute pain   Pain Onset Today   Pain Frequency Constant   Aggravating Factors  standing / sitting    Pain Relieving Factors rest/ heat    Effect of Pain on Daily Activities difficulty perfroming ADL's and IADL's             [facility] - 03/12/17 1517      Assessment   Medical Diagnosis Low back pain    Referring Provider Dr Cammy CopaGregory Scott Dean   Onset Date/Surgical Date --  5 years prior    Hand Dominance Right   Prior Therapy No therapy      Precautions   Precautions None     Restrictions   Weight Bearing Restrictions No     Balance Screen   Has the patient fallen in the past 6 months Yes   How many times? 3   Has the patient had a decrease in activity level because of a fear of falling?  No   Is the patient reluctant to leave their home because of a fear of falling?  No     Home Environment   Additional Comments 3 steps into the house     Prior Function   Level of Independence Independent with community mobility with  device   Vocation On disability   Leisure Just pick up objects      Cognition   Overall Cognitive Status Within Functional Limits for tasks assessed   Attention Focused   Focused Attention Appears intact   Memory Appears intact   Awareness Appears intact   Problem Solving Appears intact     Observation/Other Assessments   Focus on Therapeutic Outcomes (FOTO)  60% limitation      Sensation   Light Touch Appears Intact   Additional Comments Pain radiating into the left leg. difficult for thepatient to specify exactlly were      Coordination   Gross Motor Movements are Fluid and Coordinated No   Fine Motor Movements are Fluid and Coordinated No   Coordination and Movement Description Patient reports when he grabs an object he can not control his  grip      Posture/Postural Control   Posture Comments Rounded shoulders      ROM / Strength   AROM / PROM /  Strength AROM;PROM;Strength     AROM   AROM Assessment Site Lumbar   Lumbar Flexion 50% limitation with pain    Lumbar Extension 50% limitation    Lumbar - Right Side Bend Pain    Lumbar - Left Side Bend Pain    Lumbar - Right Rotation Pain    Lumbar - Left Rotation Pain      PROM   Overall PROM Comments Left hip Stiffness and pain at 90 degrees of flexion and with IR/ER    PROM Assessment Site Hip     Strength   Strength Assessment Site Knee;Hip   Right/Left Hip Right;Left   Right Hip Flexion 3+/5   Right Hip ADduction 3+/5   Left Hip Flexion 3+/5   Left Hip Extension 4/5   Left Hip ABduction 4/5   Right/Left Knee Right;Left   Right Knee Flexion 5/5   Right Knee Extension 5/5   Left Knee Flexion 5/5   Left Knee Extension 5/5     Palpation   Palpation comment spasming of the lumbar spine      Ambulation/Gait   Gait Comments decreased hip rotation; bilateral hip abduction             Objective measurements completed on examination: See above findings.          [facility] Adult PT Treatment/Exercise - 03/13/17 0001      Lumbar Exercises: Stretches   Active Hamstring Stretch Limitations 2x20sec hold seated    Single Knee to Chest Stretch Limitations 2x20sec hold with cuing for tehique    Lower Trunk Rotation Limitations x10      Manual Therapy   Manual therapy comments attmepted LAD but pateint reported it gave him a headache                PT Education - 03/13/17 1119    Education provided Yes   Education Details HEP; improtance of improving motion; symtom management    Person(s) Educated Patient   Methods Explanation;Demonstration;Tactile cues;Verbal cues   Comprehension Verbalized understanding;Returned demonstration;Verbal cues required;Tactile cues required;Need further instruction          PT Short Term Goals - 03/13/17 1134      PT SHORT TERM GOAL #1   Title Patient will demsotrate good core contraction    Time 4   Period Weeks    Status New     PT SHORT TERM GOAL #2   Title Patient will increase bilateral hamstring 90/90 length  by 20 degrees    Time 4   Period Weeks   Status New     PT SHORT TERM GOAL #3   Title Patient will be independent with inital HEP    Time 4   Period Weeks   Status New     PT SHORT TERM GOAL #4   Title Patient will report centralized lower back pain with no pain radiating into his left hip   Time 4   Period Weeks   Status New           PT Long Term Goals - 04-05-17 1136      PT LONG TERM GOAL #1   Title Patient will demsotrate 4+/5 gross bilateral lower extremity strength in order to perfrom ADL's without pain and fatigue    Time 8   Period Weeks   Status New     PT LONG TERM GOAL #2   Title Patient will sit for 1 hour without self report of increased pain and stiffness   Time 8   Period Weeks   Status New     PT LONG TERM GOAL #3   Title Patient will demonstrate a 46% limitation on FOTO    Time 8   Period Weeks   Status New                Plan - 04/05/17 1125    Clinical Impression Statement Patient is a 46 year old male who presents with chronic bilateral lower back pain with occasional radiating pain into the left side. He has increased pain when he stands and sits for too long. He has spasming on the left side. He has pain with all movements of the lumbar spine. He would benefit from skiilled therapy to improve core stability and mobility.     History and Personal Factors relevant to plan of care: Diabetes; longstanding pain in multiple areas;    Clinical Presentation Evolving   Clinical Decision Making Moderate   Rehab Potential Good   PT Frequency 2x / week   PT Duration 8 weeks   PT Treatment/Interventions ADLs/Self Care Home Management;Cryotherapy;Electrical Stimulation;Iontophoresis 4mg /ml Dexamethasone;Gait training;Stair training;Moist Heat;Ultrasound;Patient/family education;Therapeutic activities;Therapeutic exercise;Manual techniques;Passive  range of motion;Dry needling;Taping;Splinting   PT Next Visit Plan review stretches with the patient; consider manual therapy to the lumbar spine and hips; Trial prone on elbows if the patient is having radicular pain. Add calmshells with AB; Bridginfg if able; Supine march with TA    PT Home Exercise Plan single knee to chest, Hamtirng stretch seated and supine; Lateral trunk rotation,    Consulted and Agree with Plan of Care Patient      Patient will benefit from skilled therapeutic intervention in order to improve the following deficits and impairments:  Abnormal gait, Decreased strength  Visit Diagnosis: Chronic bilateral low back pain with left-sided sciatica  Muscle spasm of back  Difficulty in walking, not elsewhere classified      G-Codes - 04/05/17 1140    Functional Limitation Mobility: Walking and moving around   Mobility: Walking and Moving Around Current Status [phone]) At least 60 percent but less than 80 percent impaired, limited or restricted   Mobility: Walking and Moving Around Goal Status [phone]) At least 40 percent but less than 60 percent impaired, limited or restricted       Problem List Patient Active Problem List   Diagnosis Date Noted  . Chronic bilateral low back pain 02/13/2017  . GERD (gastroesophageal reflux disease) 02/13/2017  .  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 Type 2 diabetes mellitus with neurological complications (HCC)     Dessie Coma PT DPT  03/13/2017, 12:59 PM  [facility] [address] [place], [place], [address] Phone: [phone]   Fax:  [phone]  Name: Cirilo Canner [facility] MRN: [id number] Date of Birth: November 03, 1970

## 2017-03-13 NOTE — Addendum Note (Signed)
Addended by: Lorayne BenderARROLL, Genavieve Mangiapane on: 03/13/2017 01:03 PM   Modules accepted: Orders

## 2017-03-13 NOTE — Progress Notes (Signed)
HPI:   Jimmy Castillo is a 46 y.o. male, who is here today with his wife to follow on recent OV.   He was seen on 02/13/17, since then he has followed with Dr August Saucer (ortho) for chronic lower back pain with radiculopathy.  He is doing PT now, next visit with ortho 03/20/17.   Last OV he was also concerned about GI symptoms, abdominal bloating and burping. Also he reported having loose stools.  He states that stools are getting "very slowly" getting more solid, feeling better. He is also trying to avoid foods that have exacerbated symptoms in the past.  He is also on Metformin but he does not think medication was causing diarrhea.   GERD: Omeprazole was recommended but it seems like it was causing "gassy" sensation, so he is taking it as needed. It has helped with heartburn.  His wife also mentions that about a week ago,while he was asleep on his abdomen, she noted sudden leg movement. He kept both LE elevated with knees extended with no movement for a few seconds. He woke up after episode, he states that he was having "muffle" voice for a "couple seconds" but no other residual symptom. Denies associated urine/bowel incontinence or biting tongue. According to pt and wife, he is currently following with neurologists and has had EEG's done, last one was done the day before this reported event happened.  Brain MRI 12/2016 negative.  He also tells me that a few days ago while he was in bed, he saw a "bright white light" for a couple seconds. No associated headache, seizure like activity, or MS changes. This has happened in the past. Also he hears voices, like somebody is in his back yard, wife has being with him when this happens and she has not heard any voice or sound he describes.  He denies visual hallucinations or hearing voices talking to him directly.  He is not sure about next appt with neuro. He denies Hx of bipolar disorder or other psychiatric condition.    Review  of Systems  Constitutional: Positive for fatigue. Negative for activity change, appetite change and fever.  HENT: Negative for mouth sores, sore throat and trouble swallowing.   Eyes: Negative for redness and visual disturbance.  Respiratory: Negative for shortness of breath and wheezing.   Cardiovascular: Negative for chest pain, palpitations and leg swelling.  Gastrointestinal: Positive for diarrhea. Negative for abdominal pain, blood in stool, nausea and vomiting.  Endocrine: Negative for cold intolerance and heat intolerance.  Genitourinary: Negative for decreased urine volume and hematuria.  Neurological: Negative for dizziness, tremors, seizures, syncope, facial asymmetry, weakness and headaches.  Psychiatric/Behavioral: Positive for sleep disturbance. Negative for agitation. The patient is nervous/anxious.      Current Outpatient Prescriptions on File Prior to Visit  Medication Sig Dispense Refill  . baclofen (LIORESAL) 10 MG tablet Take 1 tablet (10 mg total) by mouth 3 (three) times daily as needed for muscle spasms. 30 each 1  . glipiZIDE (GLUCOTROL) 10 MG tablet Take 1 tablet (10 mg total) by mouth daily before breakfast. 30 tablet 2  . glucose blood test strip Dispense according to insurance. Use to test blood sugar two times a day 100 each 5  . Lancets 30G MISC Use to check blood sugar two times a day 100 each 5  . metFORMIN (GLUCOPHAGE-XR) 500 MG 24 hr tablet Take 2 tablets (1,000 mg total) by mouth 2 (two) times daily. 120 tablet 1  .  NON FORMULARY CBD oil     No current facility-administered medications on file prior to visit.      Past Medical History:  Diagnosis Date  . Allergy   . Diabetes mellitus without complication (HCC)    Allergies  Allergen Reactions  . Codeine Other (See Comments)    hallucinations  . Other Other (See Comments)    Pt allergic to all opiates - says has "weird effects on him"  . Pork-Derived Products     Social History   Social  History  . Marital status: Married    Spouse name: N/A  . Number of children: N/A  . Years of education: N/A   Occupational History  . Unemployed    Social History Main Topics  . Smoking status: Never Smoker  . Smokeless tobacco: Never Used  . Alcohol use Yes     Comment: one every few months  . Drug use: No     Comment: CBD oil  . Sexual activity: Not Asked   Other Topics Concern  . None   Social History Narrative   Patient lives with wife and daughter. Patient on disability     Vitals:   03/14/17 1041  BP: 140/70  Pulse: 71  Resp: 12   Body mass index is 32.1 kg/m.   Physical Exam  Nursing note and vitals reviewed. Constitutional: He is oriented to person, place, and time. He appears well-developed. No distress.  HENT:  Head: Atraumatic.  Mouth/Throat: Oropharynx is clear and moist and mucous membranes are normal.  Eyes: Conjunctivae and EOM are normal.  Cardiovascular: Normal rate and regular rhythm.   No murmur heard. Pulses:      Dorsalis pedis pulses are 2+ on the right side, and 2+ on the left side.  Respiratory: Effort normal and breath sounds normal. No respiratory distress.  GI: Soft. Bowel sounds are normal. He exhibits no mass. There is no hepatomegaly. There is no tenderness.  Musculoskeletal: He exhibits no edema.  Lymphadenopathy:    He has no cervical adenopathy.  Neurological: He is alert and oriented to person, place, and time. He has normal strength.  Antalgic gait.  Skin: Skin is warm. No erythema.  Psychiatric: His mood appears anxious. He expresses no suicidal ideation.  Fairly groomed, good eye contact.    ASSESSMENT AND PLAN:   Jimmy JohnBrian was seen today for follow-up.  Diagnoses and all orders for this visit:  Gastroesophageal reflux disease, esophagitis presence not specified  GERD precautions reviewed, continue Omeprazole as needed. Wt loss may help. F/U in 3-4 months.  Nocturnal leg movements  We discussed possible  etiologies: ? RLS,seizure activity among some.  Continue following with neurologists.  Visual and auditive symptoms he is reporting today ,? Hallucinations, could be associated to certain type of seizures/neurologic disorders but also to psychiatric ones. I recommend to consider psychiatry evaluation after neurologic etiology is ruled out by his neurologists.   Diarrhea, unspecified type  Improved. I still think he should hold on Metformin for a few days to monitor for changes, he should consider it in 1-2 weeks if still having diarrhea despite of dietary changes and compliance. Adequate hydration.     Betty G. [place]Jordan, MD  [facility]. [place] office.

## 2017-03-14 ENCOUNTER — Ambulatory Visit (INDEPENDENT_AMBULATORY_CARE_PROVIDER_SITE_OTHER): Payer: Medicare Other | Admitting: Family Medicine

## 2017-03-14 ENCOUNTER — Encounter: Payer: Self-pay | Admitting: Family Medicine

## 2017-03-14 VITALS — BP 140/70 | HR 71 | Resp 12 | Ht 68.0 in | Wt 211.1 lb

## 2017-03-14 DIAGNOSIS — K219 Gastro-esophageal reflux disease without esophagitis: Secondary | ICD-10-CM

## 2017-03-14 DIAGNOSIS — R197 Diarrhea, unspecified: Secondary | ICD-10-CM

## 2017-03-14 DIAGNOSIS — R258 Other abnormal involuntary movements: Secondary | ICD-10-CM | POA: Diagnosis not present

## 2017-03-14 NOTE — Patient Instructions (Addendum)
A few things to remember from today's visit:   Gastroesophageal reflux disease, esophagitis presence not specified  Nocturnal leg movements    Avoid foods that make your symptoms worse, for example coffee, chocolate,pepermeint,alcohol, and greasy food. Raising the head of your bed about 6 inches may help with nocturnal symptoms.  Avoid tobacco use. Weight loss (if you are overweight). Avoid lying down for 3 hours after eating.  Instead 3 large meals daily try small and more frequent meals during the day.    You should be evaluated immediately if bloody vomiting, bloody stools, black stools (like tar), difficulty swallowing, food gets stuck on the way down or choking when eating. Abnormal weight loss or severe abdominal pain.  If symptoms are not resolved sometimes endoscopy is necessary.  Follow with neuro. If needed we may consider holding on Metformin.   Please be sure medication list is accurate. If a new problem present, please set up appointment sooner than planned today.

## 2017-03-17 ENCOUNTER — Encounter: Payer: Self-pay | Admitting: Physical Therapy

## 2017-03-17 ENCOUNTER — Ambulatory Visit: Payer: Medicare Other | Admitting: Physical Therapy

## 2017-03-17 DIAGNOSIS — G8929 Other chronic pain: Secondary | ICD-10-CM

## 2017-03-17 DIAGNOSIS — R262 Difficulty in walking, not elsewhere classified: Secondary | ICD-10-CM | POA: Diagnosis not present

## 2017-03-17 DIAGNOSIS — M5442 Lumbago with sciatica, left side: Secondary | ICD-10-CM | POA: Diagnosis not present

## 2017-03-17 DIAGNOSIS — M62830 Muscle spasm of back: Secondary | ICD-10-CM

## 2017-03-17 NOTE — Therapy (Signed)
[facility] [address] [place], [place], [address] Phone: [phone]   Fax:  [phone]  Physical Therapy Treatment  Patient Details  Name: Jimmy Castillo MRN: [id number] Date of Birth: [birth year] Referring Provider: Dr Cammy Copa  Encounter Date: 03/17/2017      PT End of Session - 03/17/17 1458    Visit Number 2   Number of Visits 16   Date for PT Re-Evaluation 05/08/17   Authorization Type Medicare    PT Start Time 1418   PT Stop Time 1456   PT Time Calculation (min) 38 min   Activity Tolerance Patient tolerated treatment well   Behavior During Therapy [facility] for tasks assessed/performed      Past Medical History:  Diagnosis Date  . Allergy   . Diabetes mellitus without complication [facility])     Past Surgical History:  Procedure Laterality Date  . INCISION AND DRAINAGE OF WOUND Left 07/23/2016   Procedure: IRRIGATION AND DEBRIDEMENT LEFT NECK ABSCESS;  Surgeon: Ovidio Kin, MD;  Location: [facility] OR;  Service: General;  Laterality: Left;  . WISDOM TOOTH EXTRACTION      There were no vitals filed for this visit.      Subjective Assessment - 03/17/17 1422    Subjective Patient reports he fell yesterday and went through a table. He is having pain in his left shoulder. He is having pain in his left shoulder and neck and is now having increased sciatica.    Limitations Standing;Sitting;House hold activities   How long can you sit comfortably? Depends on the position    How long can you stand comfortably? Can not stand for more then a couple of minutes   How long can you walk comfortably? walking is not a problem    Diagnostic tests Lumbar MRI: L4-L5 disc herniation with annular fissure    Patient Stated Goals To have less pain    Currently in Pain? Yes   Multiple Pain Sites Yes   Pain Score 8   Pain Location Back   Pain Orientation Left;Right   Pain Descriptors / Indicators Aching;Burning;Constant   Pain Type Chronic pain   Pain Onset More than a month ago   Pain Frequency Constant   Aggravating Factors  Standing and walking    Pain Relieving Factors rest and ice    Effect of Pain on Daily Activities difficulty perfroming daily activity                          [facility] Adult PT Treatment/Exercise - 03/17/17 0001      Lumbar Exercises: Stretches   Active Hamstring Stretch Limitations 2x20sec hold seated    Piriformis Stretch Limitations Patient unable to do other stretching exercises because he began to have a headache when lying down.      Modalities   Modalities Moist Heat     Moist Heat Therapy   Number Minutes Moist Heat 10 Minutes   Moist Heat Location --  in prone to shoulders and lumbar spine      Manual Therapy   Manual therapy comments attmepted LAD but pateint reported it gave him a headache; manual trigger point release to lumbar spine in prone but patient had difficulty he reported pains in his stomach; Hip IR caused " popping in his ears" Manual therapy stopped 2nd to abnormal responses.                   PT  Short Term Goals - 03/13/17 1134      PT SHORT TERM GOAL #1   Title Patient will demsotrate good core contraction    Time 4   Period Weeks   Status New     PT SHORT TERM GOAL #2   Title Patient will increase bilateral hamstring 90/90 length by 20 degrees    Time 4   Period Weeks   Status New     PT SHORT TERM GOAL #3   Title Patient will be independent with inital HEP    Time 4   Period Weeks   Status New     PT SHORT TERM GOAL #4   Title Patient will report centralized lower back pain with no pain radiating into his left hip   Time 4   Period Weeks   Status New           PT Long Term Goals - 03/13/17 1136      PT LONG TERM GOAL #1   Title Patient will demsotrate 4+/5 gross bilateral lower extremity strength in order to perfrom ADL's without pain and fatigue    Time 8   Period Weeks   Status New     PT  LONG TERM GOAL #2   Title Patient will sit for 1 hour without self report of increased pain and stiffness   Time 8   Period Weeks   Status New     PT LONG TERM GOAL #3   Title Patient will demonstrate a 46% limitation on FOTO    Time 8   Period Weeks   Status New               Plan - 03/17/17 1459    Clinical Impression Statement Patient was very limited today. He did not have noraml repsonses to stretching. Long axis distraction caused him nausea, Hip stretching caused his ears to pop;, and soft tissue work to hi lumbar spine caused his stomach to hurt as well. He was advised if his pain levels remain high he may have to go back to the doctor. Patient unable to tolerate any stretching or exercises today. Treatment stopped early and heat applied. Improved pain with heat.    Clinical Presentation Evolving   Clinical Decision Making Moderate   Rehab Potential Good   PT Frequency 2x / week   PT Duration 8 weeks   PT Treatment/Interventions ADLs/Self Care Home Management;Cryotherapy;Electrical Stimulation;Iontophoresis 4mg /ml Dexamethasone;Gait training;Stair training;Moist Heat;Ultrasound;Patient/family education;Therapeutic activities;Therapeutic exercise;Manual techniques;Passive range of motion;Dry needling;Taping;Splinting   PT Next Visit Plan review stretches with the patient; consider manual therapy to the lumbar spine and hips; Trial prone on elbows if the patient is having radicular pain. Add calmshells with AB; Bridginfg if able; Supine march with TA    PT Home Exercise Plan single knee to chest, Hamtirng stretch seated and supine; Lateral trunk rotation,    Consulted and Agree with Plan of Care Patient      Patient will benefit from skilled therapeutic intervention in order to improve the following deficits and impairments:  Abnormal gait, Decreased strength  Visit Diagnosis: Chronic bilateral low back pain with left-sided sciatica  Muscle spasm of back  Difficulty in  walking, not elsewhere classified     Problem List Patient Active Problem List   Diagnosis Date Noted  . Chronic bilateral low back pain 02/13/2017  . GERD (gastroesophageal reflux disease) 02/13/2017  . 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 Type 2 diabetes mellitus with neurological complications (HCC)  Dessie Comaavid J Ewa Hipp PT DPT  03/17/2017, 9:13 PM  [facility] [address] [place], [place], [address] Phone: [phone]   Fax:  [phone]  Name: Jimmy Castillo MRN: [id number] Date of Birth: September 24, 1971

## 2017-03-20 ENCOUNTER — Ambulatory Visit (INDEPENDENT_AMBULATORY_CARE_PROVIDER_SITE_OTHER): Payer: Medicare Other | Admitting: Orthopedic Surgery

## 2017-03-20 ENCOUNTER — Encounter (INDEPENDENT_AMBULATORY_CARE_PROVIDER_SITE_OTHER): Payer: Self-pay | Admitting: Orthopedic Surgery

## 2017-03-20 DIAGNOSIS — M545 Low back pain: Secondary | ICD-10-CM | POA: Diagnosis not present

## 2017-03-20 MED ORDER — NABUMETONE 500 MG PO TABS: 500.0000 mg | ORAL_TABLET | Freq: Two times a day (BID) | ORAL | 3 refills | Status: DC

## 2017-03-20 MED FILL — NABUMETONE 500 MG TABLET: 500 | 30 days supply | Qty: 60 | Fill #0

## 2017-03-20 NOTE — Progress Notes (Signed)
Office Visit Note   [facility]           Date of Birth: 01-27-1971           MRN: [id number] Visit Date: 03/20/2017 Requested by: [place]az[place]Jordan, Betty G, MD [address] [place], [place] [address] PCP: [place]Jordan, Betty G, MD  Subjective: Chief Complaint  Patient presents with  . Lower Back - Follow-up    HPI: Jimmy JohnBrian is a patient with low back pain.  Since of seeing Jimmy BlonderDenise had an MRI scan of his lumbar spine.  I reviewed the scan with him.  He has some disc degeneration at L4-5 but no nerve compression.  He has been to a therapist.  He is in physical therapy.  He is having continued significant and severe back pain which radiates occasionally into the legs.  Again his MRI scan does not really match his symptoms.              ROS: All systems reviewed are negative as they relate to the chief complaint within the history of present illness.  Patient denies  fevers or chills.   Assessment & Plan: Visit Diagnoses:  1. Low back pain, unspecified back pain laterality, unspecified chronicity, with sciatica presence unspecified     Plan: Impression is mild facet arthritis and mild L4-5 disc degeneration but otherwise pretty reasonable looking MRI scan of the back.  I would favor a course of anti-inflammatory Relafen first and if that doesn't help then he will let me know and I can refer him to Dr. Alvester MorinNewton for one diagnostic and therapeutic epidural steroid injection short of that I think this is something he has to live with.  I'll see him back as needed  Follow-Up Instructions: Return if symptoms worsen or fail to improve.   Orders:  No orders of the defined types were placed in this encounter.  Meds ordered this encounter  Medications  . nabumetone (RELAFEN) 500 MG tablet    Sig: Take 1 tablet (500 mg total) by mouth 2 (two) times daily.    Dispense:  60 tablet    Refill:  3      Procedures: No procedures performed   Clinical Data: No additional  findings.  Objective: Vital Signs: There were no vitals taken for this visit.  Physical Exam:   Constitutional: Patient appears well-developed HEENT:  Head: Normocephalic Eyes:EOM are normal Neck: Normal range of motion Cardiovascular: Normal rate Pulmonary/chest: Effort normal Neurologic: Patient is alert Skin: Skin is warm Psychiatric: Patient has normal mood and affect    Ortho Exam: Orthopedic exam demonstrates good ankle dorsi flexion plantar flexion quite hamstring strength no groin pain interlocks rotation leg no paresthesias L1 S1 bilaterally palpable pedal pulses does have a lot of muscle spasm in the paraspinal muscles in the lumbar spine but no rashes noted in this area.  Specialty Comments:  No specialty comments available.  Imaging: No results found.   PMFS History: Patient Active Problem List   Diagnosis Date Noted  . Chronic bilateral low back pain 02/13/2017  . GERD (gastroesophageal reflux disease) 02/13/2017  . 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 Type 2 diabetes mellitus with neurological complications [facility](HCC)    Past Medical History:  Diagnosis Date  . Allergy   . Diabetes mellitus without complication (HCC)     Family History  Problem Relation Age of Onset  . Lung disease Father   . Stroke Father     Past Surgical History:  Procedure Laterality Date  .  INCISION AND DRAINAGE OF WOUND Left 07/23/2016   Procedure: IRRIGATION AND DEBRIDEMENT LEFT NECK ABSCESS;  Surgeon: Ovidio Kin, MD;  Location: [facility] OR;  Service: General;  Laterality: Left;  . WISDOM TOOTH EXTRACTION     Social History   Occupational History  . Unemployed    Social History Main Topics  . Smoking status: Never Smoker  . Smokeless tobacco: Never Used  . Alcohol use Yes     Comment: one every few months  . Drug use: No     Comment: CBD oil  . Sexual activity: Not on file

## 2017-03-21 ENCOUNTER — Encounter: Payer: Self-pay | Admitting: Neurology

## 2017-03-24 MED FILL — glipiZIDE 10 MG TABS: 10 | 30 days supply | Qty: 30 | Fill #2

## 2017-03-25 ENCOUNTER — Ambulatory Visit: Payer: Medicare Other | Admitting: Physical Therapy

## 2017-03-25 ENCOUNTER — Encounter: Payer: Self-pay | Admitting: Physical Therapy

## 2017-03-25 DIAGNOSIS — M5442 Lumbago with sciatica, left side: Secondary | ICD-10-CM | POA: Diagnosis not present

## 2017-03-25 DIAGNOSIS — R262 Difficulty in walking, not elsewhere classified: Secondary | ICD-10-CM

## 2017-03-25 DIAGNOSIS — M62830 Muscle spasm of back: Secondary | ICD-10-CM | POA: Diagnosis not present

## 2017-03-25 DIAGNOSIS — G8929 Other chronic pain: Secondary | ICD-10-CM

## 2017-03-25 NOTE — Patient Instructions (Signed)

## 2017-03-25 NOTE — Therapy (Signed)
[facility] [address] [place], [place], [address] Phone: [phone]   Fax:  [phone]  Physical Therapy Treatment  Patient Details  Name: Jimmy Castillo [facility] MRN: [id number] Date of Birth: [birth year] Referring Provider: Dr Cammy Copa  Encounter Date: 03/25/2017      PT End of Session - 03/25/17 1158    Visit Number 3   Number of Visits 16   Date for PT Re-Evaluation 05/08/17   PT Start Time 0930   PT Stop Time 1022   PT Time Calculation (min) 52 min   Activity Tolerance Patient tolerated treatment well   Behavior During Therapy [facility] for tasks assessed/performed      Past Medical History:  Diagnosis Date  . Allergy   . Diabetes mellitus without complication [facility])     Past Surgical History:  Procedure Laterality Date  . INCISION AND DRAINAGE OF WOUND Left 07/23/2016   Procedure: IRRIGATION AND DEBRIDEMENT LEFT NECK ABSCESS;  Surgeon: Ovidio Kin, MD;  Location: [facility] OR;  Service: General;  Laterality: Left;  . WISDOM TOOTH EXTRACTION      There were no vitals filed for this visit.      Subjective Assessment - 03/25/17 0936    Subjective Not feeling well because he ate gluten.  Hi is intolerant.  No pain change yet.  New meds:Nebumetone 500 MG 2 x a day.   Currently in Pain? Yes   Pain Score 5    Pain Location Shoulder   Pain Orientation Right   Pain Descriptors / Indicators Aching   Pain Radiating Towards to elbow   Pain Frequency Constant   Aggravating Factors  falling irritated   Pain Relieving Factors rest heat   Effect of Pain on Daily Activities difficulty with ADL   Multiple Pain Sites --  Neck pain 5-6/10 worse with eating bread.     Pain Score 8   Pain Location Back   Pain Orientation Left;Right   Pain Descriptors / Indicators Aching;Burning;Constant   Pain Type Chronic pain   Pain Frequency Constant   Aggravating Factors  standing walking,  sometimes sitting hard surface   Pain  Relieving Factors rest ice   Effect of Pain on Daily Activities ADL difficult                         [facility] Adult PT Treatment/Exercise - 03/25/17 0001      Self-Care   Self-Care ADL's   ADL's Handout issued, not reviewed     Lumbar Exercises: Stretches   Single Knee to Chest Stretch Limitations helped to decrease intermittant spasms   Lower Trunk Rotation 5 reps   Lower Trunk Rotation Limitations 10 seconds   Pelvic Tilt Limitations unable ,  multiple cues   Prone on Elbows Stretch 1 rep   Prone on Elbows Stretch Limitations noted numbness into feet so stopped   Piriformis Stretch 2 reps   Piriformis Stretch Limitations figure 4 toward opposit shoulder  each did not help right,  helped left a little     Lumbar Exercises: Supine   Clam 5 reps   Clam Limitations spasm left flank   Bent Knee Raise Limitations 1 rep back spasm hit.   Bridge Limitations 2 reps prior to low back spasm right     Lumbar Exercises: Sidelying   Clam 10 reps   Clam Limitations cues each monitored for compensation,  small motions      Modalities   Modalities  Cryotherapy     Cryotherapy   Number Minutes Cryotherapy 10 Minutes   Cryotherapy Location Lumbar Spine   Type of Cryotherapy --  cold pack, prone with pillow                PT Education - 03/25/17 1152    Education provided Yes   Education Details ADL issued not reviewed    Person(s) Educated Patient   Methods Handout   Comprehension Need further instruction          PT Short Term Goals - 03/13/17 1134      PT SHORT TERM GOAL #1   Title Patient will demsotrate good core contraction    Time 4   Period Weeks   Status New     PT SHORT TERM GOAL #2   Title Patient will increase bilateral hamstring 90/90 length by 20 degrees    Time 4   Period Weeks   Status New     PT SHORT TERM GOAL #3   Title Patient will be independent with inital HEP    Time 4   Period Weeks   Status New     PT SHORT TERM GOAL  #4   Title Patient will report centralized lower back pain with no pain radiating into his left hip   Time 4   Period Weeks   Status New           PT Long Term Goals - 03/13/17 1136      PT LONG TERM GOAL #1   Title Patient will demsotrate 4+/5 gross bilateral lower extremity strength in order to perfrom ADL's without pain and fatigue    Time 8   Period Weeks   Status New     PT LONG TERM GOAL #2   Title Patient will sit for 1 hour without self report of increased pain and stiffness   Time 8   Period Weeks   Status New     PT LONG TERM GOAL #3   Title Patient will demonstrate a 46% limitation on FOTO    Time 8   Period Weeks   Status New               Plan - 03/25/17 1158    Clinical Impression Statement Continued with exercises today.  Most ended in worsening symptoms.  Single knee to chest was helpful with decreasing low back spasms. No real decrease in pain post exercise.     PT Treatment/Interventions ADLs/Self Care Home Management;Cryotherapy;Electrical Stimulation;Iontophoresis 4mg /ml Dexamethasone;Gait training;Stair training;Moist Heat;Ultrasound;Patient/family education;Therapeutic activities;Therapeutic exercise;Manual techniques;Passive range of motion;Dry needling;Taping;Splinting   PT Next Visit Plan review stretches with the patient; consider manual therapy to the lumbar spine and hips; Trial (again) prone on elbows if the patient is having radicular pain. Add calmshells with AB; Bridginfg if able; Supine march with TA .  HEP additions as patient is able.   PT Home Exercise Plan single knee to chest, Hamtring stretch seated and supine; Lateral trunk rotation,    Consulted and Agree with Plan of Care Patient      Patient will benefit from skilled therapeutic intervention in order to improve the following deficits and impairments:  Abnormal gait, Decreased strength  Visit Diagnosis: Chronic bilateral low back pain with left-sided sciatica  Muscle  spasm of back  Difficulty in walking, not elsewhere classified     Problem List Patient Active Problem List   Diagnosis Date Noted  . Chronic bilateral low back pain 02/13/2017  .  GERD (gastroesophageal reflux disease) 02/13/2017  . 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 Type 2 diabetes mellitus with neurological complications (HCC)     Jimmy Castillo PTA 03/25/2017, 12:04 PM  [facility] [address] [place], [place], [address] Phone: [phone]   Fax:  [phone]  Name: Jimmy Castillo [facility] MRN: [id number] Date of Birth: Sep 30, 1971

## 2017-04-01 ENCOUNTER — Encounter: Payer: Medicare Other | Admitting: Physical Therapy

## 2017-04-06 ENCOUNTER — Encounter: Payer: Self-pay | Admitting: Neurology

## 2017-04-07 ENCOUNTER — Encounter: Payer: Self-pay | Admitting: Family Medicine

## 2017-04-08 ENCOUNTER — Other Ambulatory Visit: Payer: Self-pay | Admitting: Internal Medicine

## 2017-04-08 ENCOUNTER — Encounter: Payer: Self-pay | Admitting: Physical Therapy

## 2017-04-08 ENCOUNTER — Ambulatory Visit: Payer: Medicare Other | Attending: Family Medicine | Admitting: Physical Therapy

## 2017-04-08 DIAGNOSIS — M62830 Muscle spasm of back: Secondary | ICD-10-CM

## 2017-04-08 DIAGNOSIS — E118 Type 2 diabetes mellitus with unspecified complications: Principal | ICD-10-CM

## 2017-04-08 DIAGNOSIS — IMO0002 Reserved for concepts with insufficient information to code with codable children: Secondary | ICD-10-CM

## 2017-04-08 DIAGNOSIS — M5442 Lumbago with sciatica, left side: Secondary | ICD-10-CM | POA: Insufficient documentation

## 2017-04-08 DIAGNOSIS — G8929 Other chronic pain: Secondary | ICD-10-CM | POA: Diagnosis not present

## 2017-04-08 DIAGNOSIS — R262 Difficulty in walking, not elsewhere classified: Secondary | ICD-10-CM

## 2017-04-08 DIAGNOSIS — E1165 Type 2 diabetes mellitus with hyperglycemia: Secondary | ICD-10-CM

## 2017-04-08 NOTE — Therapy (Signed)
[facility] [address] [place], [place], [address] Phone: [phone]   Fax:  [phone]  Physical Therapy Treatment  Patient Details  Name: Jimmy MeekBrian Joseph [facility] MRN: [id number] Date of Birth: Jan 03, 1971 Referring Provider: Dr Cammy CopaGregory Scott Dean  Encounter Date: 04/08/2017      PT End of Session - 04/08/17 1316    Visit Number 4   Number of Visits 16   Date for PT Re-Evaluation 05/08/17   Authorization Type Medicare    PT Start Time 0930   PT Stop Time 1023   PT Time Calculation (min) 53 min   Activity Tolerance Patient tolerated treatment well   Behavior During Therapy [facility] for tasks assessed/performed      Past Medical History:  Diagnosis Date  . Allergy   . Diabetes mellitus without complication [facility](HCC)     Past Surgical History:  Procedure Laterality Date  . INCISION AND DRAINAGE OF WOUND Left 07/23/2016   Procedure: IRRIGATION AND DEBRIDEMENT LEFT NECK ABSCESS;  Surgeon: Ovidio Kinavid Newman, MD;  [facility];  Service: General;  Laterality: Left;  . WISDOM TOOTH EXTRACTION      There were no vitals filed for this visit.      Subjective Assessment - 04/08/17 0934    Subjective Patient continues to report lower back pain. He was not able to come last week. He is having multpile other medical problems which seem to be effecting him.    Limitations Standing;Sitting;House hold activities   How long can you sit comfortably? Depends on the position    How long can you stand comfortably? Can not stand for more then a couple of minutes   How long can you walk comfortably? walking is not a problem    Diagnostic tests Lumbar MRI: L4-L5 disc herniation with annular fissure    Patient Stated Goals To have less pain    Currently in Pain? Yes   Pain Score 10-Worst pain ever   Pain Location Back   Pain Orientation Right   Pain Descriptors / Indicators Aching   Pain Type Acute pain   Pain Radiating Towards to elbow    Pain  Onset Today   Pain Frequency Constant   Aggravating Factors  standing, walking, sitting    Pain Relieving Factors rest    Effect of Pain on Daily Activities difficulty with all daily tasks                          [facility]/Ex[facility] - 04/08/17 0001      Lumbar Exercises: Stretches   Single Knee to Chest Stretch Limitations 2x20sec hold with cuing for tehique    Lower Trunk Rotation Limitations 10x    Piriformis Stretch 2 reps   Piriformis Stretch Limitations figure 4 toward opposit shoulder  each did not help right,  helped left a little                PT Education - 04/08/17 0959    Education provided (P)  Yes   Education Details (P)  continue exercises and stretches as able   Person(s) Educated (P)  Patient   Methods (P)  Explanation;Demonstration;Tactile cues;Verbal cues   Comprehension (P)  Verbalized understanding;Returned demonstration;Tactile cues required;Verbal cues required          PT Short Term Goals - 04/08/17 1321      PT SHORT TERM GOAL #1   Title Patient will demsotrate good core contraction  Time 4   Period Weeks   Status On-going     PT SHORT TERM GOAL #2   Title Patient will increase bilateral hamstring 90/90 length by 20 degrees    Time 4   Period Weeks   Status On-going     PT SHORT TERM GOAL #3   Title Patient will be independent with inital HEP    Time 4   Period Weeks   Status On-going     PT SHORT TERM GOAL #4   Title Patient will report centralized lower back pain with no pain radiating into his left hip   Time 4   Period Weeks   Status On-going           PT Long Term Goals - 03/13/17 1136      PT LONG TERM GOAL #1   Title Patient will demsotrate 4+/5 gross bilateral lower extremity strength in order to perfrom ADL's without pain and fatigue    Time 8   Period Weeks   Status New     PT LONG TERM GOAL #2   Title Patient will sit for 1 hour without self report of increased pain and  stiffness   Time 8   Period Weeks   Status New     PT LONG TERM GOAL #3   Title Patient will demonstrate a 46% limitation on FOTO    Time 8   Period Weeks   Status New               Plan - 04/08/17 1317    Clinical Impression Statement Therapy found 3 things that the patient could do today. He was abel to do postural exercises with abdominal bracing and self soft tissue mobilization of the lower back. He continues to have inconsitent symptoms with soft tissue mobilization and exercises. He reported pain in the opposite foot and right hand with long axis distraction. He has significant spasming in his lower back.     History and Personal Factors relevant to plan of care: diabetes, longstanding pain in multiple areas    Clinical Presentation Evolving   Clinical Decision Making Moderate   Rehab Potential Good   PT Frequency 2x / week   PT Duration 8 weeks   PT Treatment/Interventions ADLs/Self Care Home Management;Cryotherapy;Electrical Stimulation;Iontophoresis 4mg /ml Dexamethasone;Gait training;Stair training;Moist Heat;Ultrasound;Patient/family education;Therapeutic activities;Therapeutic exercise;Manual techniques;Passive range of motion;Dry needling;Taping;Splinting   PT Next Visit Plan review stretches with the patient; consider manual therapy to the lumbar spine and hips; Trial (again) prone on elbows if the patient is having radicular pain. Add calmshells with AB; Bridginfg if able; Supine march with TA .  HEP additions as patient is able.   PT Home Exercise Plan single knee to chest, Hamtring stretch seated and supine; Lateral trunk rotation,    Consulted and Agree with Plan of Care Patient      Patient will benefit from skilled therapeutic intervention in order to improve the following deficits and impairments:  Abnormal gait, Decreased strength  Visit Diagnosis: Chronic bilateral low back pain with left-sided sciatica  Muscle spasm of back  Difficulty in walking, not  elsewhere classified     Problem List Patient Active Problem List   Diagnosis Date Noted  . Chronic bilateral low back pain 02/13/2017  . GERD (gastroesophageal reflux disease) 02/13/2017  . 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 Type 2 diabetes mellitus with neurological complications (HCC)     Dessie Coma PT DPT  04/08/2017, 1:25 PM  [facility] [address] [place], [place], [address] Phone: [phone]   Fax:  [phone]  Name: Jimmy Castillo [facility] MRN: [id number] Date of Birth: 11/27/1970

## 2017-04-10 ENCOUNTER — Ambulatory Visit: Payer: Medicare Other | Admitting: Physical Therapy

## 2017-04-10 ENCOUNTER — Encounter: Payer: Self-pay | Admitting: Physical Therapy

## 2017-04-10 DIAGNOSIS — M62830 Muscle spasm of back: Secondary | ICD-10-CM | POA: Diagnosis not present

## 2017-04-10 DIAGNOSIS — M5442 Lumbago with sciatica, left side: Principal | ICD-10-CM

## 2017-04-10 DIAGNOSIS — R262 Difficulty in walking, not elsewhere classified: Secondary | ICD-10-CM

## 2017-04-10 DIAGNOSIS — G8929 Other chronic pain: Secondary | ICD-10-CM | POA: Diagnosis not present

## 2017-04-10 NOTE — Therapy (Signed)
[facility] [address] [place], [place], [address] Phone: [phone]   Fax:  [phone]  Physical Therapy Treatment  Patient Details  Name: Jimmy Castillo [facility] MRN: [id number] Date of Birth: 05/11/1971 Referring Provider: Dr Cammy Copa  Encounter Date: 04/10/2017      PT End of Session - 04/10/17 0945    Visit Number 5   Number of Visits 16   Date for PT Re-Evaluation 05/08/17   Authorization Type Medicare    PT Start Time 0932   PT Stop Time 1015   PT Time Calculation (min) 43 min   Activity Tolerance Patient tolerated treatment well   Behavior During Therapy [facility] for tasks assessed/performed      Past Medical History:  Diagnosis Date  . Allergy   . Diabetes mellitus without complication [facility])     Past Surgical History:  Procedure Laterality Date  . INCISION AND DRAINAGE OF WOUND Left 07/23/2016   Procedure: IRRIGATION AND DEBRIDEMENT LEFT NECK ABSCESS;  Surgeon: Ovidio Kin, MD;  Location: [facility] OR;  Service: General;  Laterality: Left;  . WISDOM TOOTH EXTRACTION      There were no vitals filed for this visit.      Subjective Assessment - 04/10/17 0940    Subjective Patient reports continued lower back pain. He has been using the tennis ball which has made him sore.    Limitations Standing;Sitting;House hold activities   How long can you sit comfortably? Depends on the position    How long can you stand comfortably? Can not stand for more then a couple of minutes   How long can you walk comfortably? walking is not a problem    Diagnostic tests Lumbar MRI: L4-L5 disc herniation with annular fissure    Currently in Pain? Yes   Pain Score 8    Pain Location Back   Pain Orientation Right   Pain Descriptors / Indicators Aching   Pain Type Acute pain   Pain Radiating Towards towrd the middle of the spine    Pain Onset Today   Aggravating Factors  standing and walking    Pain Relieving Factors rest    Effect of Pain on Daily Activities diffiulty with all tasks                          [facility] Adult PT Treatment/Exercise - 04/10/17 0001      Lumbar Exercises: Stretches   Single Knee to Chest Stretch Limitations 2x20sec hold with cuing for tehique    Lower Trunk Rotation Limitations 10x    Piriformis Stretch Limitations 2x30 sec hold      Lumbar Exercises: Supine   Clam Limitations 2x10 red    Other Supine Lumbar Exercises ball squeeze 2x10      Modalities   Modalities Cryotherapy     Cryotherapy   Number Minutes Cryotherapy 10 Minutes   Cryotherapy Location Lumbar Spine     Manual Therapy   Manual therapy comments manual trigger point release to the right; Gentle L4/L5 PA mobilization                 PT Education - 04/10/17 0942    Education provided Yes   Education Details reviewed technique for all exercises.    Person(s) Educated Patient   Methods Explanation;Demonstration;Tactile cues;Verbal cues   Comprehension Verbalized understanding;Returned demonstration;Verbal cues required          PT Short Term Goals - 04/08/17 1321  PT SHORT TERM GOAL #1   Title Patient will demsotrate good core contraction    Time 4   Period Weeks   Status On-going     PT SHORT TERM GOAL #2   Title Patient will increase bilateral hamstring 90/90 length by 20 degrees    Time 4   Period Weeks   Status On-going     PT SHORT TERM GOAL #3   Title Patient will be independent with inital HEP    Time 4   Period Weeks   Status On-going     PT SHORT TERM GOAL #4   Title Patient will report centralized lower back pain with no pain radiating into his left hip   Time 4   Period Weeks   Status On-going           PT Long Term Goals - 03/13/17 1136      PT LONG TERM GOAL #1   Title Patient will demsotrate 4+/5 gross bilateral lower extremity strength in order to perfrom ADL's without pain and fatigue    Time 8   Period Weeks   Status New     PT  LONG TERM GOAL #2   Title Patient will sit for 1 hour without self report of increased pain and stiffness   Time 8   Period Weeks   Status New     PT LONG TERM GOAL #3   Title Patient will demonstrate a 46% limitation on FOTO    Time 8   Period Weeks   Status New               Plan - 04/10/17 0946    Clinical Impression Statement Patient continue s to be limited in his ability to perfrom activity. He  requires cuing for his basic stretches. He feels like the soft tissue mobilizations are making painright in the middle of his spiane. He was advised not to press on his spine its self and to focus more on the muscles.    History and Personal Factors relevant to plan of care: diabetes, longstanding pain in multiple area.    Clinical Presentation Evolving   Clinical Decision Making Moderate   Rehab Potential Good   PT Frequency 2x / week   PT Duration 8 weeks   PT Treatment/Interventions ADLs/Self Care Home Management;Cryotherapy;Electrical Stimulation;Iontophoresis 4mg /ml Dexamethasone;Gait training;Stair training;Moist Heat;Ultrasound;Patient/family education;Therapeutic activities;Therapeutic exercise;Manual techniques;Passive range of motion;Dry needling;Taping;Splinting   PT Next Visit Plan review stretches with the patient; consider manual therapy to the lumbar spine and hips; Trial (again) prone on elbows if the patient is having radicular pain. Add calmshells with AB; Bridginfg if able; Supine march with TA .  HEP additions as patient is able.   PT Home Exercise Plan single knee to chest, Hamtring stretch seated and supine; Lateral trunk rotation,    Consulted and Agree with Plan of Care Patient      Patient will benefit from skilled therapeutic intervention in order to improve the following deficits and impairments:  Abnormal gait, Decreased strength, Pain, Decreased range of motion, Increased muscle spasms  Visit Diagnosis: Chronic bilateral low back pain with left-sided  sciatica  Muscle spasm of back  Difficulty in walking, not elsewhere classified     Problem List Patient Active Problem List   Diagnosis Date Noted  . Chronic bilateral low back pain 02/13/2017  . GERD (gastroesophageal reflux disease) 02/13/2017  . 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 Type 2 diabetes mellitus with neurological complications (HCC)  Dessie Coma PT DPT  04/10/2017, 1:21 PM  [facility] [address] [place], [place], [address] Phone: [phone]   Fax:  [phone]  Name: Jamond Neels [facility] MRN: [id number] Date of Birth: April 14, 1971

## 2017-04-11 ENCOUNTER — Encounter: Payer: Self-pay | Admitting: Family Medicine

## 2017-04-11 ENCOUNTER — Other Ambulatory Visit: Payer: Self-pay

## 2017-04-11 DIAGNOSIS — E118 Type 2 diabetes mellitus with unspecified complications: Principal | ICD-10-CM

## 2017-04-11 DIAGNOSIS — E1165 Type 2 diabetes mellitus with hyperglycemia: Secondary | ICD-10-CM

## 2017-04-11 DIAGNOSIS — IMO0002 Reserved for concepts with insufficient information to code with codable children: Secondary | ICD-10-CM

## 2017-04-11 MED ORDER — GLIPIZIDE 10 MG PO TABS: 10.0000 mg | ORAL_TABLET | Freq: Every day | ORAL | 1 refills | Status: DC

## 2017-04-11 MED ORDER — METFORMIN HCL ER 500 MG PO TB24: 1000.0000 mg | ORAL_TABLET | Freq: Two times a day (BID) | ORAL | 1 refills | Status: DC

## 2017-04-15 ENCOUNTER — Ambulatory Visit: Payer: Medicare Other | Admitting: Physical Therapy

## 2017-04-15 ENCOUNTER — Other Ambulatory Visit: Payer: Self-pay | Admitting: Internal Medicine

## 2017-04-15 ENCOUNTER — Encounter: Payer: Self-pay | Admitting: Physical Therapy

## 2017-04-15 DIAGNOSIS — M5442 Lumbago with sciatica, left side: Principal | ICD-10-CM

## 2017-04-15 DIAGNOSIS — R262 Difficulty in walking, not elsewhere classified: Secondary | ICD-10-CM

## 2017-04-15 DIAGNOSIS — E118 Type 2 diabetes mellitus with unspecified complications: Principal | ICD-10-CM

## 2017-04-15 DIAGNOSIS — G8929 Other chronic pain: Secondary | ICD-10-CM | POA: Diagnosis not present

## 2017-04-15 DIAGNOSIS — IMO0002 Reserved for concepts with insufficient information to code with codable children: Secondary | ICD-10-CM

## 2017-04-15 DIAGNOSIS — M62830 Muscle spasm of back: Secondary | ICD-10-CM

## 2017-04-15 DIAGNOSIS — E1165 Type 2 diabetes mellitus with hyperglycemia: Secondary | ICD-10-CM

## 2017-04-15 NOTE — Therapy (Signed)
[facility] [address] [place], [place], [address] Phone: [phone]   Fax:  [phone]  Physical Therapy Treatment  Patient Details  Name: Winthrop Shannahan [facility] MRN: [id number] Date of Birth: [birth year] Referring Provider: Dr Cammy Copa  Encounter Date: 04/15/2017      PT End of Session - 04/15/17 0939    Visit Number 6   Number of Visits 16   Date for PT Re-Evaluation 05/08/17   Authorization Type Medicare    PT Start Time 0934   PT Stop Time 1015   PT Time Calculation (min) 41 min   Activity Tolerance Patient tolerated treatment well   Behavior During Therapy [facility] for tasks assessed/performed      Past Medical History:  Diagnosis Date  . Allergy   . Diabetes mellitus without complication [facility])     Past Surgical History:  Procedure Laterality Date  . INCISION AND DRAINAGE OF WOUND Left 07/23/2016   Procedure: IRRIGATION AND DEBRIDEMENT LEFT NECK ABSCESS;  Surgeon: Ovidio Kin, MD;  Location: [facility] OR;  Service: General;  Laterality: Left;  . WISDOM TOOTH EXTRACTION      There were no vitals filed for this visit.      Subjective Assessment - 04/15/17 0936    Subjective Patient reports the bak is still sore. He reports it has been so/so. His pain today is about a 5/10. He walked here from his house.    Limitations Standing;Sitting;House hold activities   How long can you sit comfortably? Depends on the position    How long can you stand comfortably? Can not stand for more then a couple of minutes   How long can you walk comfortably? walking is not a problem    Diagnostic tests Lumbar MRI: L4-L5 disc herniation with annular fissure    Patient Stated Goals To have less pain    Currently in Pain? Yes   Pain Score 5    Pain Location Back   Pain Orientation Right   Pain Descriptors / Indicators Aching   Pain Type Acute pain   Pain Radiating Towards towards the middle of the back    Pain Onset Today   Pain Frequency Constant   Aggravating Factors  standing and walking    Pain Relieving Factors rest    Effect of Pain on Daily Activities difficulty with all tasks                          [facility] Adult PT Treatment/Exercise - 04/15/17 1336      Lumbar Exercises: Stretches   Single Knee to Chest Stretch Limitations 2x20sec hold with cuing for tehique    Lower Trunk Rotation Limitations 10x    Quad Stretch Limitations Thomas stretch for illiopsoas 2x30sec hold on the right    Piriformis Stretch Limitations 2x30 sec hold      Lumbar Exercises: Standing   Other Standing Lumbar Exercises shoulder extension 1x10 with TA; scapualr retraction x10;      Lumbar Exercises: Supine   Clam Limitations 2x10 red    Other Supine Lumbar Exercises ball squeeze 2x10      Modalities   Modalities Cryotherapy     Cryotherapy   Number Minutes Cryotherapy 10 Minutes   Cryotherapy Location Lumbar Spine     Manual Therapy   Manual therapy comments manual trigger point release to the right; Gentle L4/L5 PA mobilization  PT Education - 04/15/17 [phone]    Education provided Yes   Education Details continued to review technique with exercises.    Person(s) Educated Patient   Methods Explanation;Demonstration;Tactile cues;Verbal cues   Comprehension Verbalized understanding;Returned demonstration;Verbal cues required          PT Short Term Goals - 04/08/17 1321      PT SHORT TERM GOAL #1   Title Patient will demsotrate good core contraction    Time 4   Period Weeks   Status On-going     PT SHORT TERM GOAL #2   Title Patient will increase bilateral hamstring 90/90 length by 20 degrees    Time 4   Period Weeks   Status On-going     PT SHORT TERM GOAL #3   Title Patient will be independent with inital HEP    Time 4   Period Weeks   Status On-going     PT SHORT TERM GOAL #4   Title Patient will report centralized lower back pain with no pain radiating  into his left hip   Time 4   Period Weeks   Status On-going           PT Long Term Goals - 03/13/17 1136      PT LONG TERM GOAL #1   Title Patient will demsotrate 4+/5 gross bilateral lower extremity strength in order to perfrom ADL's without pain and fatigue    Time 8   Period Weeks   Status New     PT LONG TERM GOAL #2   Title Patient will sit for 1 hour without self report of increased pain and stiffness   Time 8   Period Weeks   Status New     PT LONG TERM GOAL #3   Title Patient will demonstrate a 46% limitation on FOTO    Time 8   Period Weeks   Status New               Plan - 04/15/17 0946    Clinical Impression Statement Although the patient reports some improvement his FOTO disability has increased The patient was encouraged to continue his stretching and strengthening at home. He reports he has not fallen in a few weeks. He does not feel like his legs are giving out. Therapy will continue until his re-assesment at visit 10 we will continue idf he continues to make some progress. He also reports he feels like he becomes " vapor locked" were he has so much gas in him he can not bend down. Therapy advised him to see a GI doctor or his surgeon. Patient was gievn the thomas stretch 2nd to other pains deep in his stomach. Therapy advised him if the pain improves it may be his deep hip flexor. If it dosent contact his GI doctor.    History and Personal Factors relevant to plan of care: diabetes, long standing pain in multiple areas.    Clinical Presentation Evolving   Clinical Decision Making Moderate   Rehab Potential Good   PT Frequency 2x / week   PT Duration 2 weeks   PT Treatment/Interventions ADLs/Self Care Home Management;Cryotherapy;Electrical Stimulation;Iontophoresis 4mg /ml Dexamethasone;Gait training;Stair training;Moist Heat;Ultrasound;Patient/family education;Therapeutic activities;Therapeutic exercise;Manual techniques;Passive range of motion;Dry  needling;Taping;Splinting   PT Next Visit Plan review stretches with the patient; consider manual therapy to the lumbar spine and hips; Trial (again) prone on elbows if the patient is having radicular pain. Add calmshells with AB; Bridginfg if able; Supine march with TA .  HEP additions as patient is able.   PT Home Exercise Plan single knee to chest, Hamtring stretch seated and supine; Lateral trunk rotation,    Consulted and Agree with Plan of Care Patient      Patient will benefit from skilled therapeutic intervention in order to improve the following deficits and impairments:  Abnormal gait, Decreased strength, Pain, Decreased range of motion, Increased muscle spasms  Visit Diagnosis: Chronic bilateral low back pain with left-sided sciatica  Muscle spasm of back  Difficulty in walking, not elsewhere classified     Problem List Patient Active Problem List   Diagnosis Date Noted  . Chronic bilateral low back pain 02/13/2017  . GERD (gastroesophageal reflux disease) 02/13/2017  . 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 Type 2 diabetes mellitus with neurological complications (HCC)     Dessie Comaavid J Kalah Pflum  PT DPT  04/15/2017, 1:46 PM  [facility] [address] [place], [place], [address] Phone: [phone]   Fax:  [phone]  Name: Tamsen MeekBrian Joseph [facility] MRN: [id number] Date of Birth: Mar 05, 1971

## 2017-04-17 ENCOUNTER — Encounter: Payer: Self-pay | Admitting: Physical Therapy

## 2017-04-17 ENCOUNTER — Other Ambulatory Visit: Payer: Self-pay

## 2017-04-17 ENCOUNTER — Ambulatory Visit: Payer: Medicare Other | Admitting: Physical Therapy

## 2017-04-17 ENCOUNTER — Telehealth: Payer: Self-pay | Admitting: Family Medicine

## 2017-04-17 DIAGNOSIS — E118 Type 2 diabetes mellitus with unspecified complications: Principal | ICD-10-CM

## 2017-04-17 DIAGNOSIS — M62830 Muscle spasm of back: Secondary | ICD-10-CM | POA: Diagnosis not present

## 2017-04-17 DIAGNOSIS — G8929 Other chronic pain: Secondary | ICD-10-CM

## 2017-04-17 DIAGNOSIS — E1165 Type 2 diabetes mellitus with hyperglycemia: Secondary | ICD-10-CM

## 2017-04-17 DIAGNOSIS — IMO0002 Reserved for concepts with insufficient information to code with codable children: Secondary | ICD-10-CM

## 2017-04-17 DIAGNOSIS — R262 Difficulty in walking, not elsewhere classified: Secondary | ICD-10-CM | POA: Diagnosis not present

## 2017-04-17 DIAGNOSIS — M5442 Lumbago with sciatica, left side: Secondary | ICD-10-CM | POA: Diagnosis not present

## 2017-04-17 MED ORDER — GLIPIZIDE 10 MG PO TABS: 10.0000 mg | ORAL_TABLET | Freq: Every day | ORAL | 1 refills | Status: DC

## 2017-04-17 NOTE — Therapy (Signed)
[place], [place], [address] Phone: [phone]   Fax:  [phone]  Physical Therapy Treatment  Patient Details  Name: Jimmy Castillo MRN: [id number] Date of Birth: [birth year] Referring Provider: Dr Jimmy Castillo  Encounter Date: 04/17/2017      PT End of Session - 04/17/17 1017    Visit Number 7   Number of Visits 16   PT Start Time 0931   PT Stop Time 1026   PT Time Calculation (min) 55 min   Activity Tolerance Patient tolerated treatment well   Behavior During Therapy Jimmy Castillo for tasks assessed/performed      Past Medical History:  Diagnosis Date  . Allergy   . Diabetes mellitus without complication [facility])     Past Surgical History:  Procedure Laterality Date  . INCISION AND DRAINAGE OF WOUND Left 07/23/2016   Procedure: IRRIGATION AND DEBRIDEMENT LEFT NECK ABSCESS;  Surgeon: Jimmy Overall, MD;  Location: [facility];  Service: General;  Laterality: Left;  . WISDOM TOOTH EXTRACTION      There were no vitals filed for this visit.      Subjective Assessment - 04/17/17 0932    Subjective Patient not feeling well from new Med Met formin. I felt something relase this morning and my back pop and it seems to be beytter.  Weird sharp pains on the bottom of my feet have been going away at night.    Currently in Pain? Yes   Pain Score --  no  back pain,  has flank pain.     Pain Location Back   Pain Descriptors / Indicators Dull;Aching   Pain Radiating Towards lower abdominal area   Pain Frequency Constant   Aggravating Factors  walking,  jumping off a bus   Pain Relieving Factors rest   Pain Type Chronic pain                         [facility] Adult PT Treatment/Exercise - 04/17/17 0001      Self-Care   Self-Care Other Self-Care Comments  suggested counseling for stress management   Other Self-Care Comments  Patient had increased pain and beginning of a head ach as he was  talking about all his stresses in life,  including back spasm.       Lumbar Exercises: Stretches   Single Knee to Chest Stretch Limitations 3 X 10 seconds   Lower Trunk Rotation Limitations 10 X   Quad Stretch Limitations 3 X 30 seconds  both,  combe piriformis stretch     Lumbar Exercises: Supine   Ab Set 5 reps   Clam 5 reps  red band.  Cramp in left along ribs.Previous injury of 2 YRS   Bridge 10 reps   Bridge Limitations small Castillo   Other Supine Lumbar Exercises ball squeeze 2x10      Lumbar Exercises: Quadruped   Single Arm Raise 5 reps   Single Arm Raises Limitations cues   Straight Leg Raises Limitations 3 reps, min assist for balance. , lower leg Castillo ,  cues     Cryotherapy   Number Minutes Cryotherapy 10 Minutes   Cryotherapy Location Lumbar Spine   Type of Cryotherapy --  cold pack, prone                  PT Short Term Goals - 04/17/17 1006      PT SHORT TERM GOAL #1   Title  Patient will demsotrate good core contraction    Baseline unable to hold longer   Time 4   Period Weeks   Status On-going     PT SHORT TERM GOAL #2   Title Patient will increase bilateral hamstring 90/90 length by 20 degrees    Time 4   Period Weeks   Status Unable to assess     PT SHORT TERM GOAL #3   Title Patient will be independent with inital HEP    Time 4   Period Weeks   Status Unable to assess     PT SHORT TERM GOAL #4   Title Patient will report centralized lower back pain with no pain radiating into his left hip   Baseline symptoms change back pain intetmittant today with lower abdominals and flanl pain   Time 4   Period Weeks   Status On-going           PT Long Term Goals - 03/13/17 1136      PT LONG TERM GOAL #1   Title Patient will demsotrate 4+/5 gross bilateral lower extremity strength in order to perfrom ADL's without pain and fatigue    Time 8   Period Weeks   Status New     PT LONG TERM GOAL #2   Title Patient will sit for 1 hour  without self report of increased pain and stiffness   Time 8   Period Weeks   Status New     PT LONG TERM GOAL #3   Title Patient will demonstrate a 46% limitation on FOTO    Time 8   Period Weeks   Status New               Plan - 04/17/17 1017    Clinical Impression Statement Able to tolerate quadriped today.  Something in his back popped this morning at home abd back pain improved.  No new goals met.  Pain increased wihen he discussed all his problems.  Counseling reccomended. Patient agreed.   PT Treatment/Interventions ADLs/Self Care Home Management;Cryotherapy;Electrical Stimulation;Iontophoresis 42m/ml Dexamethasone;Gait training;Stair training;Moist Heat;Ultrasound;Patient/family education;Therapeutic activities;Therapeutic exercise;Manual techniques;Passive range of motion;Dry needling;Taping;Splinting   PT Next Visit Plan review stretches with the patient; consider manual therapy to the lumbar spine and hips; Trial (again) prone on elbows if the patient is having radicular pain. Add calmshells with AB; Bridginfg if able; Supine march with TA .  HEP additions as patient is able..Jimmy Castillo.   PT Home Exercise Plan single knee to chest, Hamstring stretch seated and supine; Lateral trunk rotation,    Consulted and Agree with Plan of Care Patient      Patient will benefit from skilled therapeutic intervention in order to improve the following deficits and impairments:  Abnormal gait, Decreased strength, Pain, Decreased range of motion, Increased muscle spasms  Visit Diagnosis: Chronic bilateral low back pain with left-sided sciatica  Muscle spasm of back  Difficulty in walking, not elsewhere classified     Problem List Patient Active Problem List   Diagnosis Date Noted  . Chronic bilateral low back pain 02/13/2017  . GERD (gastroesophageal reflux disease) 02/13/2017  . 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 Type 2 diabetes mellitus with neurological  complications ([place]     Jimmy Castillo PTA 04/17/2017, 11:06 AM  [place] [address]: [phone]  Fax:  [phone] Name: Jimmy [facility]: [id number] of Birth: [birth year]

## 2017-04-17 NOTE — Telephone Encounter (Signed)
[facility] requesting [facility] to keep on file for the future.  [facility].  Please review and send in.

## 2017-04-17 NOTE — Telephone Encounter (Signed)
[facility]. Nothing further needed.

## 2017-04-18 ENCOUNTER — Other Ambulatory Visit: Payer: Self-pay | Admitting: Internal Medicine

## 2017-04-18 DIAGNOSIS — IMO0002 Reserved for concepts with insufficient information to code with codable children: Secondary | ICD-10-CM

## 2017-04-18 DIAGNOSIS — E118 Type 2 diabetes mellitus with unspecified complications: Principal | ICD-10-CM

## 2017-04-18 DIAGNOSIS — E1165 Type 2 diabetes mellitus with hyperglycemia: Secondary | ICD-10-CM

## 2017-04-28 MED FILL — glipiZIDE 10 MG TABS: 10 | 90 days supply | Qty: 90 | Fill #0

## 2017-04-30 ENCOUNTER — Ambulatory Visit: Payer: Medicare Other | Admitting: Physical Therapy

## 2017-04-30 DIAGNOSIS — M62830 Muscle spasm of back: Secondary | ICD-10-CM

## 2017-04-30 DIAGNOSIS — G8929 Other chronic pain: Secondary | ICD-10-CM | POA: Diagnosis not present

## 2017-04-30 DIAGNOSIS — M5442 Lumbago with sciatica, left side: Secondary | ICD-10-CM | POA: Diagnosis not present

## 2017-04-30 DIAGNOSIS — R262 Difficulty in walking, not elsewhere classified: Secondary | ICD-10-CM

## 2017-04-30 NOTE — Therapy (Signed)
[facility] [address] [place], [place], [address] Phone: [phone]   Fax:  [phone]  Physical Therapy Treatment  Patient Details  Name: Jimmy MeekBrian Joseph [facility] MRN: [id number] Date of Birth: 03/16/1971 Referring Provider: Dr Cammy CopaGregory Scott Dean  Encounter Date: 04/30/2017      PT End of Session - 04/30/17 1419    Visit Number 8   Number of Visits 16   Date for PT Re-Evaluation 05/08/17   Authorization Type Medicare    PT Start Time 1410   PT Stop Time 1453   PT Time Calculation (min) 43 min   Activity Tolerance Patient tolerated treatment well   Behavior During Therapy [facility] for tasks assessed/performed      Past Medical History:  Diagnosis Date  . Allergy   . Diabetes mellitus without complication [facility](HCC)     Past Surgical History:  Procedure Laterality Date  . INCISION AND DRAINAGE OF WOUND Left 07/23/2016   Procedure: IRRIGATION AND DEBRIDEMENT LEFT NECK ABSCESS;  Surgeon: Ovidio Kinavid Newman, MD;  [facility];  Service: General;  Laterality: Left;  . WISDOM TOOTH EXTRACTION      There were no vitals filed for this visit.      Subjective Assessment - 04/30/17 1412    Subjective Patient feels like his back overall has been a little better but today he is sore from riding the bus. The last visit his sugar crashed, He is not sure why.    Limitations Standing;Sitting;House hold activities   How long can you sit comfortably? Depends on the position    How long can you stand comfortably? Can not stand for more then a couple of minutes   How long can you walk comfortably? walking is not a problem    Diagnostic tests Lumbar MRI: L4-L5 disc herniation with annular fissure    Patient Stated Goals To have less pain    Pain Score 4    Pain Location Back   Pain Orientation Right   Pain Descriptors / Indicators Aching;Dull   Pain Type Acute pain   Pain Radiating Towards lower abdominal area    Pain Onset Today   Pain  Frequency Constant   Aggravating Factors  walking, jumping off the bus    Pain Relieving Factors rest    Effect of Pain on Daily Activities difficulty    Multiple Pain Sites No                         [facility] Adult PT Treatment/Exercise - 04/30/17 0001      Lumbar Exercises: Stretches   Single Knee to Chest Stretch Limitations 3 X 10 seconds   Lower Trunk Rotation Limitations 10 X   Piriformis Stretch Limitations 2x30 sec hold      Lumbar Exercises: Supine   Bridge 10 reps   Bridge Limitations small lifts   Other Supine Lumbar Exercises ball squeeze 2x10      Lumbar Exercises: Quadruped   Single Arm Raise 5 reps     Modalities   Modalities Cryotherapy     Cryotherapy   Number Minutes Cryotherapy 10 Minutes   Cryotherapy Location Lumbar Spine     Manual Therapy   Manual therapy comments manual trigger point release to the right; Gentle L4/L5 PA mobilization                 PT Education - 04/30/17 1418    Education provided Yes   Education Details continue  with exercises; continue with stretches   Person(s) Educated Patient   Methods Explanation;Demonstration;Tactile cues;Verbal cues   Comprehension Verbalized understanding;Returned demonstration;Verbal cues required;Tactile cues required          PT Short Term Goals - 04/30/17 1425      PT SHORT TERM GOAL #1   Title Patient will demsotrate good core contraction    Baseline unable to hold longer   Time 4   Period Weeks   Status On-going     PT SHORT TERM GOAL #2   Title Patient will increase bilateral hamstring 90/90 length by 20 degrees    Time 4   Period Weeks   Status On-going     PT SHORT TERM GOAL #3   Title Patient will be independent with inital HEP    Time 4   Period Weeks   Status On-going     PT SHORT TERM GOAL #4   Title Patient will report centralized lower back pain with no pain radiating into his left hip   Baseline symptoms change back pain intetmittant today with  lower abdominals and flanl pain   Time 4   Period Weeks   Status On-going           PT Long Term Goals - 03/13/17 1136      PT LONG TERM GOAL #1   Title Patient will demsotrate 4+/5 gross bilateral lower extremity strength in order to perfrom ADL's without pain and fatigue    Time 8   Period Weeks   Status New     PT LONG TERM GOAL #2   Title Patient will sit for 1 hour without self report of increased pain and stiffness   Time 8   Period Weeks   Status New     PT LONG TERM GOAL #3   Title Patient will demonstrate a 46% limitation on FOTO    Time 8   Period Weeks   Status New               Plan - 04/30/17 1419    Clinical Impression Statement Patient tolerated treamtnet well. He was able to do all exercises without a significant increase in pain. He was encouraged to continue with his exercises at home.    Clinical Presentation Evolving   Clinical Decision Making Moderate   Rehab Potential Good   PT Frequency 2x / week   PT Duration 8 weeks   PT Treatment/Interventions ADLs/Self Care Home Management;Cryotherapy;Electrical Stimulation;Iontophoresis 4mg /ml Dexamethasone;Gait training;Stair training;Moist Heat;Ultrasound;Patient/family education;Therapeutic activities;Therapeutic exercise;Manual techniques;Passive range of motion;Dry needling;Taping;Splinting   PT Next Visit Plan review stretches with the patient; consider manual therapy to the lumbar spine and hips; Trial (again) prone on elbows if the patient is having radicular pain. Add calmshells with AB; Bridginfg if able; Supine march with TA .  HEP additions as patient is able.Thomes Cake.  Quadriped arm/ leg lifts.   PT Home Exercise Plan single knee to chest, Hamstring stretch seated and supine; Lateral trunk rotation,    Consulted and Agree with Plan of Care Patient      Patient will benefit from skilled therapeutic intervention in order to improve the following deficits and impairments:  Abnormal gait, Decreased  strength, Pain, Decreased range of motion, Increased muscle spasms  Visit Diagnosis: Chronic bilateral low back pain with left-sided sciatica  Muscle spasm of back  Difficulty in walking, not elsewhere classified     Problem List Patient Active Problem List   Diagnosis Date Noted  . Chronic bilateral  low back pain 02/13/2017  . GERD (gastroesophageal reflux disease) 02/13/2017  . 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 Type 2 diabetes mellitus with neurological complications (HCC)     Dessie Coma PT DPT  04/30/2017, 3:50 PM  [facility] [address] [place], [place], [address] Phone: [phone]   Fax:  [phone]  Name: Bretton Tandy [facility] MRN: [id number] Date of Birth: 11-25-1970

## 2017-05-06 ENCOUNTER — Ambulatory Visit: Payer: Medicare Other | Admitting: Physical Therapy

## 2017-05-06 ENCOUNTER — Encounter: Payer: Self-pay | Admitting: Physical Therapy

## 2017-05-06 DIAGNOSIS — G8929 Other chronic pain: Secondary | ICD-10-CM | POA: Diagnosis not present

## 2017-05-06 DIAGNOSIS — R262 Difficulty in walking, not elsewhere classified: Secondary | ICD-10-CM | POA: Diagnosis not present

## 2017-05-06 DIAGNOSIS — M5442 Lumbago with sciatica, left side: Principal | ICD-10-CM

## 2017-05-06 DIAGNOSIS — M62830 Muscle spasm of back: Secondary | ICD-10-CM

## 2017-05-06 NOTE — Therapy (Signed)
[facility] [address] [place], [place], [address] Phone: [phone]   Fax:  [phone]  Physical Therapy Treatment  Patient Details  Name: Jimmy [facility] MRN: [id number] Date of Birth: May 07, 1971 Referring Provider: Dr Cammy Copa  Encounter Date: 05/06/2017      PT End of Session - 05/06/17 0758    Visit Number 9   Number of Visits 16   Date for PT Re-Evaluation 05/08/17   Authorization Type Medicare    PT Start Time 0752   PT Stop Time 0838   PT Time Calculation (min) 46 min   Activity Tolerance Patient tolerated treatment well   Behavior During Therapy [facility] for tasks assessed/performed      Past Medical History:  Diagnosis Date  . Allergy   . Diabetes mellitus without complication [facility])     Past Surgical History:  Procedure Laterality Date  . INCISION AND DRAINAGE OF WOUND Left 07/23/2016   Procedure: IRRIGATION AND DEBRIDEMENT LEFT NECK ABSCESS;  Surgeon: Ovidio Kin, MD;  Location: [facility] OR;  Service: General;  Laterality: Left;  . WISDOM TOOTH EXTRACTION      There were no vitals filed for this visit.      Subjective Assessment - 05/06/17 0755    Subjective Patient reports he has had other issuses over the weekend besides his back. He reports his back has been a little better.    Limitations Standing;Sitting;House hold activities   How long can you sit comfortably? Depends on the position    How long can you stand comfortably? Can not stand for more then a couple of minutes   How long can you walk comfortably? walking is not a problem    Diagnostic tests Lumbar MRI: L4-L5 disc herniation with annular fissure    Patient Stated Goals To have less pain    Currently in Pain? Yes   Pain Score 3    Pain Location Back   Pain Orientation Right   Pain Descriptors / Indicators Aching;Dull   Pain Type Acute pain   Pain Radiating Towards lower abdomianl area    Pain Onset Today   Pain Frequency  Constant   Aggravating Factors  waliking,    Pain Relieving Factors rest    Effect of Pain on Daily Activities diffciulty perfroming ADL's                          [facility] Adult PT Treatment/Exercise - 05/06/17 0001      Lumbar Exercises: Stretches   Single Knee to Chest Stretch Limitations 3 X 10 seconds   Lower Trunk Rotation Limitations 10 X   Piriformis Stretch Limitations 2x30 sec hold      Lumbar Exercises: Standing   Other Standing Lumbar Exercises standing march 2x10 with core stabilization 2x10;    Other Standing Lumbar Exercises hip abduction 2x10; hip extension 2x10      Lumbar Exercises: Supine   Clam Limitations 2x10 red    Bridge 10 reps   Bridge Limitations small lifts   Other Supine Lumbar Exercises ball squeeze 2x10      Cryotherapy   Number Minutes Cryotherapy 10 Minutes   Cryotherapy Location Lumbar Spine     Manual Therapy   Manual therapy comments manual trigger point release to the right; Gentle L4/L5 PA mobilization                 PT Education - 05/06/17 0758    Education  provided Yes   Education Details reviewed standing exercises for HEP    Person(s) Educated Patient   Methods Explanation;Demonstration;Tactile cues;Verbal cues   Comprehension Verbalized understanding;Returned demonstration;Verbal cues required;Tactile cues required          PT Short Term Goals - 04/30/17 1425      PT SHORT TERM GOAL #1   Title Patient will demsotrate good core contraction    Baseline unable to hold longer   Time 4   Period Weeks   Status On-going     PT SHORT TERM GOAL #2   Title Patient will increase bilateral hamstring 90/90 length by 20 degrees    Time 4   Period Weeks   Status On-going     PT SHORT TERM GOAL #3   Title Patient will be independent with inital HEP    Time 4   Period Weeks   Status On-going     PT SHORT TERM GOAL #4   Title Patient will report centralized lower back pain with no pain radiating into his  left hip   Baseline symptoms change back pain intetmittant today with lower abdominals and flanl pain   Time 4   Period Weeks   Status On-going           PT Long Term Goals - 03/13/17 1136      PT LONG TERM GOAL #1   Title Patient will demsotrate 4+/5 gross bilateral lower extremity strength in order to perfrom ADL's without pain and fatigue    Time 8   Period Weeks   Status New     PT LONG TERM GOAL #2   Title Patient will sit for 1 hour without self report of increased pain and stiffness   Time 8   Period Weeks   Status New     PT LONG TERM GOAL #3   Title Patient will demonstrate a 46% limitation on FOTO    Time 8   Period Weeks   Status New               Plan - 05/06/17 0759    Clinical Impression Statement Patient continues to make some progress with his back. He seems overwelmed by other problems.  He tolerated new standing exercises well. Updated HEP.    History and Personal Factors relevant to plan of care: diabetes, long standing multiple pain issues.    Clinical Presentation Evolving   Clinical Decision Making Moderate   Rehab Potential Good   PT Frequency 2x / week   PT Duration 8 weeks   PT Treatment/Interventions ADLs/Self Care Home Management;Cryotherapy;Electrical Stimulation;Iontophoresis 4mg /ml Dexamethasone;Gait training;Stair training;Moist Heat;Ultrasound;Patient/family education;Therapeutic activities;Therapeutic exercise;Manual techniques;Passive range of motion;Dry needling;Taping;Splinting   PT Next Visit Plan review stretches with the patient; consider manual therapy to the lumbar spine and hips; Trial (again) prone on elbows if the patient is having radicular pain. Add calmshells with AB; Bridginfg if able; Supine march with TA .  HEP additions as patient is able.Thomes Cake.  Quadriped arm/ leg lifts.   PT Home Exercise Plan single knee to chest, Hamstring stretch seated and supine; Lateral trunk rotation,    Consulted and Agree with Plan of Care  Patient      Patient will benefit from skilled therapeutic intervention in order to improve the following deficits and impairments:  Abnormal gait, Decreased strength, Pain, Decreased range of motion, Increased muscle spasms  Visit Diagnosis: Chronic bilateral low back pain with left-sided sciatica  Muscle spasm of back  Difficulty in walking,  not elsewhere classified       G-Codes - 05/06/17 0804    Functional Assessment Tool Used (Outpatient Only) FOTO, clinicaldecision making    Functional Limitation Mobility: Walking and moving around   Mobility: Walking and Moving Around Current Status [phone]([id number]) At least 60 percent but less than 80 percent impaired, limited or restricted   Mobility: Walking and Moving Around Goal Status [phone](G8979) At least 40 percent but less than 60 percent impaired, limited or restricted      Problem List Patient Active Problem List   Diagnosis Date Noted  . Chronic bilateral low back pain 02/13/2017  . GERD (gastroesophageal reflux disease) 02/13/2017  . 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 Type 2 diabetes mellitus with neurological complications (HCC)     Dessie Comaavid J Asher Babilonia PT DPT  05/06/2017, 8:54 AM  [facility] [address] [place], [place], [address] Phone: [phone]   Fax:  [phone]  Name: Tamsen MeekBrian Joseph [facility] MRN: [id number] Date of Birth: 1970-10-15

## 2017-05-08 ENCOUNTER — Ambulatory Visit: Payer: Medicare Other | Attending: Family Medicine | Admitting: Physical Therapy

## 2017-05-08 ENCOUNTER — Encounter: Payer: Self-pay | Admitting: Physical Therapy

## 2017-05-08 DIAGNOSIS — M62830 Muscle spasm of back: Secondary | ICD-10-CM | POA: Insufficient documentation

## 2017-05-08 DIAGNOSIS — R262 Difficulty in walking, not elsewhere classified: Secondary | ICD-10-CM | POA: Insufficient documentation

## 2017-05-08 DIAGNOSIS — G8929 Other chronic pain: Secondary | ICD-10-CM | POA: Insufficient documentation

## 2017-05-08 DIAGNOSIS — M5442 Lumbago with sciatica, left side: Secondary | ICD-10-CM | POA: Insufficient documentation

## 2017-05-08 NOTE — Therapy (Addendum)
[facility] [place], [place], [address] Phone: [phone]   Fax:  [phone]  Physical Therapy Treatment/ Discharge   Patient Details  Name: Jamarea Selner [facility] MRN: [id number] Date of Birth: [birth year] Referring Provider: Dr Meredith Pel  Encounter Date: 05/08/2017      PT End of Session - 05/08/17 0947    Visit Number 10   Number of Visits 16   Date for PT Re-Evaluation 05/08/17   PT Start Time 0800   PT Stop Time 9179   PT Time Calculation (min) 47 min   Activity Tolerance Patient tolerated treatment well   Behavior During Therapy [facility] for tasks assessed/performed;Restless      Past Medical History:  Diagnosis Date  . Allergy   . Diabetes mellitus without complication [facility])     Past Surgical History:  Procedure Laterality Date  . INCISION AND DRAINAGE OF WOUND Left 07/23/2016   Procedure: IRRIGATION AND DEBRIDEMENT LEFT NECK ABSCESS;  Surgeon: Alphonsa Overall, MD;  Location: [facility];  Service: General;  Laterality: Left;  . WISDOM TOOTH EXTRACTION      There were no vitals filed for this visit.      Subjective Assessment - 05/08/17 0804    Subjective Last visit today. 2-3/10.  I bent over and felt something snap in his neck.  Abdominal area improving  (Acid volcano)   Currently in Pain? Yes   Pain Score 4   3-4/10   Pain Location Back   Pain Orientation Right   Pain Descriptors / Indicators --  feels decent   Pain Type Chronic pain   Pain Radiating Towards lower abdominal area   Pain Frequency Constant   Aggravating Factors  walking   Pain Relieving Factors rest,  not eating much   Effect of Pain on Daily Activities difficult with ADL   Pain Location Neck   Pain Orientation Mid   Pain Descriptors / Indicators --  It feels like something went pop in my neck   Pain Frequency Constant   Aggravating Factors  leaning to reach and neck went pop   Pain Relieving Factors not sure it just  happened.   Effect of Pain on Daily Activities exta time                         [facility] Adult PT Treatment/Exercise - 05/08/17 0001      Self-Care   Other Self-Care Comments  Communi     Lumbar Exercises: Stretches   Passive Hamstring Stretch 3 reps;30 seconds  sitting and supine,  cues   Single Knee to Chest Stretch Limitations 3 X 10 seconds   Lower Trunk Rotation Limitations 10 x     Lumbar Exercises: Supine   Ab Set 5 reps   AB Set Limitations transversus abdominus - cramping low back, and pain right lower abdomin   Clam Limitations with transversus abdominus 5 X stopped due to pain   Bent Knee Raise 5 reps   with TRA,  stopped due to increased pain.   Bridge 10 reps   Bridge Limitations small lifts   Other Supine Lumbar Exercises --                  PT Short Term Goals - 05/08/17 0836      PT SHORT TERM GOAL #1   Title Patient will demsotrate good core contraction    Baseline unable.  painful rt abdominal area  Time 4   Period Weeks   Status Not Met     PT SHORT TERM GOAL #2   Title Patient will increase bilateral hamstring 90/90 length by 20 degrees    Baseline 35 right,  45 left   Time 4   Period Weeks   Status Not Met     PT SHORT TERM GOAL #3   Title Patient will be independent with inital HEP    Baseline cues   Time 4   Period Weeks   Status Partially Met     PT SHORT TERM GOAL #4   Title Patient will report centralized lower back pain with no pain radiating into his left hip   Baseline symptoms change back pain intetmittant today with lower abdominals and flank pain   Time 4   Period Weeks   Status Partially Met           PT Long Term Goals - 05/08/17 0946      PT LONG TERM GOAL #1   Title Patient will demsotrate 4+/5 gross bilateral lower extremity strength in order to perfrom ADL's without pain and fatigue    Time 8   Status Unable to assess     PT LONG TERM GOAL #2   Title Patient will sit for 1 hour  without self report of increased pain and stiffness   Baseline 30 minutes   Time 8   Period Weeks   Status Not Met     PT LONG TERM GOAL #3   Title Patient will demonstrate a 46% limitation on FOTO    Baseline 48% limitation   Time 8   Period Weeks   Status Partially Met               Plan - 05/08/17 0948    Clinical Impression Statement Patient requested discharge today due to needing time to work on personal issues.  Many of his goals are not met or are partially met.  Hamstrings continue to be tight with 35 degrees on right and 45 degrees on the left.  His pain is improving with less frequent radiating pain into leg.  Pain continues into lower right abdominals and he had intermittant cramping during exercises. He is encouraged to continue stretching and to get help from community agencies.  Information for food and financial assist were issued.   He  had FOTO score improve to 48% limitation.   PT Next Visit Plan Discharge to HEP at patient's request.    PT Home Exercise Plan single knee to chest, Hamstring stretch seated and supine; Lateral trunk rotation,    Recommended Other Services financial assist, food pantry, free meals, counseling for mental health.    Consulted and Agree with Plan of Care Patient      Patient will benefit from skilled therapeutic intervention in order to improve the following deficits and impairments:  Abnormal gait, Decreased strength, Pain, Decreased range of motion, Increased muscle spasms  Visit Diagnosis: Chronic bilateral low back pain with left-sided sciatica  Muscle spasm of back  Difficulty in walking, not elsewhere classified  PHYSICAL THERAPY DISCHARGE SUMMARY  Visits from Start of Care:10  Current functional level related to goals / functional outcomes: Improved back pain with ADL's    Remaining deficits: Continues to have pain    Education / Equipment: HEP Plan: Patient agrees to discharge.  Patient goals were not met.  Patient is being discharged due to the patient's request.  ?????  Problem List Patient Active Problem List   Diagnosis Date Noted  . Chronic bilateral low back pain 02/13/2017  . GERD (gastroesophageal reflux disease) 02/13/2017  . 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 Type 2 diabetes mellitus with neurological complications ([facility])     Cornelio Parkerson PTA 05/08/2017, 9:58 AM  [facility] [address] [place], [place], [address] Phone: [phone]   Fax:  [phone]  Name: Evart Mcdonnell [facility] MRN: [id number] Date of Birth: 1971-07-07

## 2017-06-15 NOTE — Progress Notes (Signed)
HPI:   [name] is a 46 y.o. male, who is here today to follow on some chronic medical problems.  Sine his last OV, 03/14/17, he has followed with Dr August Saucerean and done PT for chronic lower back pain.  Diabetes Mellitus II:    Dx in 2013. Currently on Metformin 1000 mg bid and Glipizide 10 mg daily.  Checking BS's : FG 130-150. Post prandial at night 180's-200'2, a few 300's.  Hypoglycemia: 60's "sometimes". He states that he tries not to skip meals but sometimes he does not eat because GI symptoms.  He states that while he was in court he had a "very low" BS, he did not check it. Felt warm as he is feeling now, resolved spontaneously after a few minutes.   He denies vomiting, polydipsia, polyuria, or polyphagia.  Stable feet numbness, tingling and burning.Which he attributes to his back pain.   Lab Results  Component Value Date   CREATININE 1.07 02/13/2017   BUN 19 02/13/2017   NA 137 02/13/2017   K 4.3 02/13/2017   CL 102 02/13/2017   CO2 26 02/13/2017    Lab Results  Component Value Date   HGBA1C 7.6 01/28/2017   Lab Results  Component Value Date   MICROALBUR 1.3 02/13/2017    GERD:  Currently he is on Omeprazole 40 mg prn. Heartburn improved since he changed his diet.  States that he has to stop eating "some vegetables" because GI issues.  He is still having diarrhea and flatulence. + Burping and bloating sensation. Most foods exacerbate symptoms. He has not identified alleviating factors.  He has 1-2 stools, "sting" and loose. Sometimes he sees undigested food in stool. He denies blood in stool or melena.  He has had these symptoms for about 5 months.  He does not think Metformin is causing symptoms.  He is following with ortho and was doing PT for lower back pain. He also follows with neurologists.   Review of Systems  Constitutional: Positive for fatigue. Negative for activity change, appetite change, fever and unexpected weight  change.  HENT: Negative for mouth sores, nosebleeds, sore throat and trouble swallowing.   Eyes: Negative for redness and visual disturbance.  Respiratory: Negative for cough, shortness of breath and wheezing.   Cardiovascular: Negative for chest pain, palpitations and leg swelling.  Gastrointestinal: Positive for abdominal pain and diarrhea. Negative for blood in stool, nausea and vomiting.  Endocrine: Negative for cold intolerance, heat intolerance, polydipsia, polyphagia and polyuria.  Genitourinary: Negative for decreased urine volume, dysuria and hematuria.  Musculoskeletal: Positive for back pain. Negative for gait problem.  Skin: Negative for rash and wound.  Neurological: Positive for numbness. Negative for seizures, syncope, weakness and headaches.  Psychiatric/Behavioral: Negative for confusion. The patient is nervous/anxious.       Current Outpatient Prescriptions on File Prior to Visit  Medication Sig Dispense Refill  . glipiZIDE (GLUCOTROL) 10 MG tablet Take 1 tablet (10 mg total) by mouth daily before breakfast. 90 tablet 1  . glucose blood test strip Dispense according to insurance. Use to test blood sugar two times a day 100 each 5  . Lancets 30G MISC Use to check blood sugar two times a day 100 each 5  . nabumetone (RELAFEN) 500 MG tablet Take 1 tablet (500 mg total) by mouth 2 (two) times daily. 60 tablet 3  . NON FORMULARY CBD oil    . omeprazole (PRILOSEC) 40 MG capsule Take 40 mg by mouth  as needed.     No current facility-administered medications on file prior to visit.      Past Medical History:  Diagnosis Date  . Allergy   . Diabetes mellitus without complication (HCC)    Allergies  Allergen Reactions  . Codeine Other (See Comments)    hallucinations  . Other Other (See Comments)    Pt allergic to all opiates - says has "weird effects on him"  . Pork-Derived Products     Social History   Social History  . Marital status: Married    Spouse name:  N/A  . Number of children: N/A  . Years of education: N/A   Occupational History  . Unemployed    Social History Main Topics  . Smoking status: Never Smoker  . Smokeless tobacco: Never Used  . Alcohol use Yes     Comment: one every few months  . Drug use: No     Comment: CBD oil  . Sexual activity: Not Asked   Other Topics Concern  . None   Social History Narrative   Patient lives with wife and daughter. Patient on disability     Vitals:   06/16/17 0948  BP: 102/70  Pulse: 71  Resp: 12  SpO2: 97%   Body mass index is 31.51 kg/m.  Wt Readings from Last 3 Encounters:  06/16/17 207 lb 4 oz (94 kg)  03/14/17 211 lb 2 oz (95.8 kg)  02/13/17 207 lb 8 oz (94.1 kg)    Physical Exam  Nursing note and vitals reviewed. Constitutional: He is oriented to person, place, and time. He appears well-developed. No distress.  HENT:  Head: Normocephalic and atraumatic.  Mouth/Throat: Oropharynx is clear and moist and mucous membranes are normal.  Eyes: Pupils are equal, round, and reactive to light. Conjunctivae are normal.  Cardiovascular: Normal rate and regular rhythm.   No murmur heard. Pulses:      Dorsalis pedis pulses are 2+ on the right side, and 2+ on the left side.  Respiratory: Effort normal and breath sounds normal. No respiratory distress.  GI: Soft. Bowel sounds are normal. He exhibits no mass. There is no hepatomegaly. There is no tenderness.  Musculoskeletal: He exhibits no edema.       Thoracic back: He exhibits no tenderness and no bony tenderness.       Lumbar back: He exhibits tenderness (with movement on exam table and palpation of paraspinal muscles,bilateral). He exhibits no bony tenderness.  Lymphadenopathy:    He has no cervical adenopathy.  Neurological: He is alert and oriented to person, place, and time. He has normal strength. Coordination and gait normal.  Skin: Skin is warm. No rash noted. No erythema.  Psychiatric: His mood appears anxious.  Well  groomed, good eye contact.    Foot exam 02/13/17.  ASSESSMENT AND PLAN:   Jimmy Castillo was seen today for follow-up.  Diagnoses and all orders for this visit:  Lab Results  Component Value Date   CHOL 125 06/18/2017   HDL 37.60 (L) 06/18/2017   LDLCALC 50 06/18/2017   TRIG 184.0 (H) 06/18/2017   CHOLHDL 3 06/18/2017   Lab Results  Component Value Date   HGBA1C 6.8 (H) 06/18/2017   Lab Results  Component Value Date   CREATININE 1.08 06/18/2017   BUN 18 06/18/2017   NA 138 06/18/2017   K 4.4 06/18/2017   CL 104 06/18/2017   CO2 23 06/18/2017    Type 2 diabetes mellitus with neurological complications (HCC)  HgA1C has not been at goal. Because Metformin could be aggravating GI symptoms, he agrees to stop it. We discussed a few options, agrees with Jardiance 10 mg daily.We dicussed some side effects. No changes in Glipizide but if he continues BS's 60's, we may need to discontinue it. Avoid skipping meals.   Today fingerstick: 158. Regular exercise and healthy diet with avoidance of added sugar food intake is an important part of treatment and recommended. Annual eye exam, periodic dental and foot care recommended. F/U in 4 months.  -     empagliflozin (JARDIANCE) 10 MG TABS tablet; Take 10 mg by mouth daily. -     POC Glucose (CBG) -     Lipid panel -     Fructosamine -     Hemoglobin A1c -     Basic metabolic panel  Gastroesophageal reflux disease, esophagitis presence not specified  Improved. Continue PPI as needed. GERD precautions discussed.  Diarrhea, unspecified type  ? IBS,? Med side effects among some discussed. Today he agrees with holding Metformin and monitor for changes in GI symptoms. Since this problem has not improved, GI evaluation recommended.  -     Ambulatory referral to Gastroenterology  Chronic bilateral low back pain with sciatica, sciatica laterality unspecified  States that PT did not help. Continue following with Dr August Saucer. He has  some underlying anxiety, he may benefit from Cymbalta.   Class 1 obesity with serious comorbidity and body mass index (BMI) of 31.0 to 31.9 in adult, unspecified obesity type  We discussed benefits of wt loss as well as adverse effects of obesity. Consistency with healthy diet and physical activity recommended.  Mixed hyperlipidemia  Further recommendations will be given according to FLP results.   He is not fasting today, so will be back for fasting labs in a couple days.   -Jimmy Castillo [facility] was advised to return sooner than planned today if new concerns arise.       Beckham Capistran G. [place], MD  [facility]. [place] office.

## 2017-06-16 ENCOUNTER — Encounter: Payer: Self-pay | Admitting: Family Medicine

## 2017-06-16 ENCOUNTER — Ambulatory Visit (INDEPENDENT_AMBULATORY_CARE_PROVIDER_SITE_OTHER): Payer: Medicare Other | Admitting: Family Medicine

## 2017-06-16 VITALS — BP 102/70 | HR 71 | Resp 12 | Ht 68.0 in | Wt 207.2 lb

## 2017-06-16 DIAGNOSIS — E1149 Type 2 diabetes mellitus with other diabetic neurological complication: Secondary | ICD-10-CM

## 2017-06-16 DIAGNOSIS — R197 Diarrhea, unspecified: Secondary | ICD-10-CM | POA: Diagnosis not present

## 2017-06-16 DIAGNOSIS — K219 Gastro-esophageal reflux disease without esophagitis: Secondary | ICD-10-CM | POA: Diagnosis not present

## 2017-06-16 DIAGNOSIS — E782 Mixed hyperlipidemia: Secondary | ICD-10-CM | POA: Diagnosis not present

## 2017-06-16 DIAGNOSIS — G8929 Other chronic pain: Secondary | ICD-10-CM | POA: Diagnosis not present

## 2017-06-16 DIAGNOSIS — M5440 Lumbago with sciatica, unspecified side: Secondary | ICD-10-CM | POA: Diagnosis not present

## 2017-06-16 DIAGNOSIS — E669 Obesity, unspecified: Secondary | ICD-10-CM

## 2017-06-16 DIAGNOSIS — E785 Hyperlipidemia, unspecified: Secondary | ICD-10-CM | POA: Insufficient documentation

## 2017-06-16 DIAGNOSIS — Z6831 Body mass index (BMI) 31.0-31.9, adult: Secondary | ICD-10-CM | POA: Diagnosis not present

## 2017-06-16 LAB — GLUCOSE, POCT (MANUAL RESULT ENTRY): POC GLUCOSE: 158 mg/dL — AB (ref 70–99)

## 2017-06-16 MED ORDER — EMPAGLIFLOZIN 10 MG PO TABS: 10.0000 mg | ORAL_TABLET | Freq: Every day | ORAL | 3 refills | Status: DC

## 2017-06-16 NOTE — Patient Instructions (Addendum)
A few things to remember from today's visit:   Type 2 diabetes mellitus with neurological complications (HCC)  Gastroesophageal reflux disease, esophagitis presence not specified  Diarrhea, unspecified type  HgA1C goal < 7.0. Avoid sugar added food:regular soft drinks, energy drinks, and sports drinks. candy. cakes. cookies. pies and cobblers. sweet rolls, pastries, and donuts. fruit drinks, such as fruitades and fruit punch. dairy desserts, such as ice cream  Mediterranean diet has showed benefits for sugar control.  How much and what type of carbohydrate foods are important for managing diabetes. The balance between how much insulin is in your body and the carbohydrate you eat makes a difference in your blood glucose levels.  Fasting blood sugar ideally 130 or less, 2 hours after meals less than 180.   Regular exercise also will help with controlling disease, daily brisk walking as tolerated for 15-30 min definitively will help.   Avoid skipping meals, blood sugar might drop and cause serious problems. Remember checking feet periodically, good dental hygiene, and annual eye exam.    Lab in 2 days,fasting. Today Metformin stopped and Jardiance started.This may help with stomach issues      Please be sure medication list is accurate. If a new problem present, please set up appointment sooner than planned today.

## 2017-06-18 ENCOUNTER — Other Ambulatory Visit: Payer: Medicare Other

## 2017-06-18 DIAGNOSIS — E1149 Type 2 diabetes mellitus with other diabetic neurological complication: Secondary | ICD-10-CM | POA: Diagnosis not present

## 2017-06-18 LAB — HEMOGLOBIN A1C: HEMOGLOBIN A1C: 6.8 % — AB (ref 4.6–6.5)

## 2017-06-18 LAB — LIPID PANEL
CHOL/HDL RATIO: 3
CHOLESTEROL: 125 mg/dL (ref 0–200)
HDL: 37.6 mg/dL — ABNORMAL LOW (ref >39.00)
LDL CALC: 50 mg/dL (ref 0–99)
NONHDL: 86.99
Triglycerides: 184 mg/dL — ABNORMAL HIGH (ref 0.0–149.0)
VLDL: 36.8 mg/dL (ref 0.0–40.0)

## 2017-06-18 LAB — BASIC METABOLIC PANEL
BUN: 18 mg/dL (ref 6–23)
CALCIUM: 9.4 mg/dL (ref 8.4–10.5)
CO2: 23 mEq/L (ref 19–32)
CREATININE: 1.08 mg/dL (ref 0.40–1.50)
Chloride: 104 mEq/L (ref 96–112)
GFR: 77.6 mL/min (ref >60.00)
Glucose, Bld: 154 mg/dL — ABNORMAL HIGH (ref 70–99)
Potassium: 4.4 mEq/L (ref 3.5–5.1)
Sodium: 138 mEq/L (ref 135–145)

## 2017-06-19 ENCOUNTER — Encounter: Payer: Self-pay | Admitting: Family Medicine

## 2017-06-20 LAB — FRUCTOSAMINE: Fructosamine: 200 umol/L (ref 190–270)

## 2017-06-22 ENCOUNTER — Encounter: Payer: Self-pay | Admitting: Family Medicine

## 2017-06-25 ENCOUNTER — Encounter: Payer: Self-pay | Admitting: Family Medicine

## 2017-06-26 ENCOUNTER — Encounter: Payer: Self-pay | Admitting: Family Medicine

## 2017-06-27 ENCOUNTER — Telehealth: Payer: Self-pay | Admitting: Family Medicine

## 2017-06-27 NOTE — Telephone Encounter (Signed)
° ° °  Jimmy Castillo with the Pharmacy call to ask if the doctor is going to send in a alternate med for the pt since the below med is to expensive    empagliflozin (JARDIANCE) 10 MG TABS tablet    Pharmacy [facility]

## 2017-06-27 NOTE — Telephone Encounter (Signed)
This message was sent to him through My Chart a few days ago.   Last visit I also recommend Jardiance but this medication is expensive and seems like it is not covered by your insurance.  Options are: [place], Onglyza, low dose Actos. Injectable (not insulin) Victoza or Trulicity. I do not want to increase Glipizide because you are already having low blood sugars.  Please let me know which one you health insurance covers.    Thanks, BJ

## 2017-06-30 ENCOUNTER — Encounter: Payer: Self-pay | Admitting: Gastroenterology

## 2017-06-30 NOTE — Telephone Encounter (Signed)
I spoke with patient. He is okay with sticking with the metformin, he still has some. He is keeping an eye on his blood sugars as well. I advised him that if his blood sugars start to be below 70 multiple times to let us know. He says that currently they are between 78ish-100 or so. Patient will inform us when he needs a new prescription and he will let us know if his sugars are becoming to low.

## 2017-06-30 NOTE — Telephone Encounter (Signed)
I tried contacting patient to see which alternative he would like sent in, unable to leave a voicemail.

## 2017-07-28 MED FILL — glipiZIDE 10 MG TABS: 10 | 90 days supply | Qty: 90 | Fill #1

## 2017-08-04 MED FILL — METFORMIN HCL ER 500 MG TAB: 500 | 90 days supply | Qty: 360 | Fill #0

## 2017-08-20 ENCOUNTER — Ambulatory Visit (INDEPENDENT_AMBULATORY_CARE_PROVIDER_SITE_OTHER): Payer: Medicare Other | Admitting: Gastroenterology

## 2017-08-20 ENCOUNTER — Other Ambulatory Visit (INDEPENDENT_AMBULATORY_CARE_PROVIDER_SITE_OTHER): Payer: Medicare Other

## 2017-08-20 ENCOUNTER — Encounter: Payer: Self-pay | Admitting: Gastroenterology

## 2017-08-20 VITALS — BP 98/76 | HR 84 | Ht 67.75 in | Wt 205.2 lb

## 2017-08-20 DIAGNOSIS — R197 Diarrhea, unspecified: Secondary | ICD-10-CM

## 2017-08-20 DIAGNOSIS — R1031 Right lower quadrant pain: Secondary | ICD-10-CM

## 2017-08-20 DIAGNOSIS — K219 Gastro-esophageal reflux disease without esophagitis: Secondary | ICD-10-CM | POA: Diagnosis not present

## 2017-08-20 LAB — HEPATIC FUNCTION PANEL
ALK PHOS: 40 U/L (ref 39–117)
ALT: 26 U/L (ref 0–53)
AST: 22 U/L (ref 0–37)
Albumin: 4.5 g/dL (ref 3.5–5.2)
BILIRUBIN DIRECT: 0.1 mg/dL (ref 0.0–0.3)
TOTAL PROTEIN: 7.5 g/dL (ref 6.0–8.3)
Total Bilirubin: 0.6 mg/dL (ref 0.2–1.2)

## 2017-08-20 LAB — CBC WITH DIFFERENTIAL/PLATELET
BASOS ABS: 0 10*3/uL (ref 0.0–0.1)
Basophils Relative: 0.4 % (ref 0.0–3.0)
EOS ABS: 0.1 10*3/uL (ref 0.0–0.7)
Eosinophils Relative: 0.9 % (ref 0.0–5.0)
HEMATOCRIT: 46 % (ref 39.0–52.0)
Hemoglobin: 15.7 g/dL (ref 13.0–17.0)
LYMPHS PCT: 32.9 % (ref 12.0–46.0)
Lymphs Abs: 3 10*3/uL (ref 0.7–4.0)
MCHC: 34.1 g/dL (ref 30.0–36.0)
MCV: 88.7 fl (ref 78.0–100.0)
Monocytes Absolute: 0.5 10*3/uL (ref 0.1–1.0)
Monocytes Relative: 5.6 % (ref 3.0–12.0)
NEUTROS ABS: 5.4 10*3/uL (ref 1.4–7.7)
NEUTROS PCT: 60.2 % (ref 43.0–77.0)
Platelets: 251 10*3/uL (ref 150.0–400.0)
RBC: 5.19 Mil/uL (ref 4.22–5.81)
RDW: 12.6 % (ref 11.5–15.5)
WBC: 9 10*3/uL (ref 4.0–10.5)

## 2017-08-20 LAB — BASIC METABOLIC PANEL
BUN: 16 mg/dL (ref 6–23)
CHLORIDE: 102 meq/L (ref 96–112)
CO2: 26 meq/L (ref 19–32)
CREATININE: 1.18 mg/dL (ref 0.40–1.50)
Calcium: 10.2 mg/dL (ref 8.4–10.5)
GFR: 70.01 mL/min (ref >60.00)
GLUCOSE: 113 mg/dL — AB (ref 70–99)
Potassium: 4.4 mEq/L (ref 3.5–5.1)
Sodium: 139 mEq/L (ref 135–145)

## 2017-08-20 LAB — IGA: IGA: 211 mg/dL (ref 68–378)

## 2017-08-20 LAB — TSH: TSH: 4.8 u[IU]/mL — ABNORMAL HIGH (ref 0.35–4.50)

## 2017-08-20 MED ORDER — DICYCLOMINE HCL 10 MG PO CAPS: 10.0000 mg | ORAL_CAPSULE | Freq: Three times a day (TID) | ORAL | 11 refills | Status: DC

## 2017-08-20 MED FILL — DICYCLOMINE 10 MG CAPSULE: 10 | 23 days supply | Qty: 90 | Fill #0

## 2017-08-20 NOTE — Patient Instructions (Addendum)
We have sent the following medications to your pharmacy for you to pick up at your convenience: Bentyl.  Your physician has requested that you go to the basement for lab work before leaving today.  It has been recommended to you by your physician that you have a(n) Colonoscopy completed. Per your request, we did not schedule the procedure(s) today. Please contact our office at [phone] when you decide to have the procedure completed.  You have been scheduled for a CT scan of the abdomen and pelvis at [facility] ([address]---this is in the same building as Press [facility]).   You are scheduled on 08/25/17 at 3:30pm. You should arrive 15 minutes prior to your appointment time for registration. Please follow the written instructions below on the day of your exam:  WARNING: IF YOU ARE ALLERGIC TO IODINE/X-RAY DYE, PLEASE NOTIFY RADIOLOGY IMMEDIATELY AT [phone]! YOU WILL BE GIVEN A 13 HOUR PREMEDICATION PREP.  1) Do not eat or drink anything after 11:30am (4 hours prior to your test) 2) You have been given 2 bottles of oral contrast to drink. The solution may taste               better if refrigerated, but do NOT add ice or any other liquid to this solution. Shake             well before drinking.    Drink 1 bottle of contrast @ 1:30pm (2 hours prior to your exam)  Drink 1 bottle of contrast @ 2:30pm (1 hour prior to your exam)  You may take any medications as prescribed with a small amount of water except for the following: Metformin, Glucophage, Glucovance, Avandamet, Riomet, Fortamet, Actoplus Met, Janumet, Glumetza or Metaglip. The above medications must be held the day of the exam AND 48 hours after the exam.  The purpose of you drinking the oral contrast is to aid in the visualization of your intestinal tract. The contrast solution may cause some diarrhea. Before your exam is started, you will be given a small amount of fluid to drink. Depending on your individual  set of symptoms, you may also receive an intravenous injection of x-ray contrast/dye. Plan on being at [facility] for 30 minutes or longer, depending on the type of exam you are having performed.  This test typically takes 30-45 minutes to complete.  If you have any questions regarding your exam or if you need to reschedule, you may call the CT department at [phone] between the hours of 8:00 am and 5:00 pm, Monday-Friday.  ________________________________________________________________________  Normal BMI (Body Mass Index- based on height and weight) is between 19 and 25. Your BMI today is Body mass index is 31.44 kg/m. Marland Kitchen Please consider follow up  regarding your BMI with your Primary Care Provider.  Thank you for choosing me and [facility].  Pricilla Riffle. Dagoberto Ligas., MD., [facility] Regal

## 2017-08-20 NOTE — Progress Notes (Signed)
History of Present Illness: This is a 46 year old male referred by Swazil[place]Jordan, Betty G, MD for the evaluation of chronic right lower quadrant pain and chronic diarrhea.  Patient relates a 20-year or so history of loose urgent watery bowel movements.  Frequently symptoms are related to a meal.  He has ongoing right lower quadrant pain that has worsened over the past several months.  The right lower quadrant pain is partially relieved with bowel movements.  He states he had mild rectal bleeding several months ago but none in the past few months.  Infrequent GERD symptoms controlled on as needed omeprazole.  Denies weight loss, constipation,  change in stool caliber, melena, hematochezia, nausea, vomiting, dysphagia,  chest pain.   Allergies  Allergen Reactions  . Codeine Other (See Comments)    hallucinations  . Other Other (See Comments)    Pt allergic to all opiates - says has "weird effects on him"  . Pork-Derived Products    Outpatient Medications Prior to Visit  Medication Sig Dispense Refill  . glipiZIDE (GLUCOTROL) 10 MG tablet Take 1 tablet (10 mg total) by mouth daily before breakfast. 90 tablet 1  . glucose blood test strip Dispense according to insurance. Use to test blood sugar two times a day 100 each 5  . Lancets 30G MISC Use to check blood sugar two times a day 100 each 5  . metFORMIN (GLUCOPHAGE) 1000 MG tablet Take 1,000 mg 2 (two) times daily with a meal by mouth.    . nabumetone (RELAFEN) 500 MG tablet Take 1 tablet (500 mg total) by mouth 2 (two) times daily. 60 tablet 3  . omeprazole (PRILOSEC) 40 MG capsule Take 40 mg by mouth as needed.    . empagliflozin (JARDIANCE) 10 MG TABS tablet Take 10 mg by mouth daily. 30 tablet 3  . NON FORMULARY CBD oil     No facility-administered medications prior to visit.    Past Medical History:  Diagnosis Date  . Allergy   . Anxiety   . Depression   . Diabetes mellitus without complication (HCC)   . Seizures (HCC)    Past  Surgical History:  Procedure Laterality Date  . WISDOM TOOTH EXTRACTION     Social History   Socioeconomic History  . Marital status: Married    Spouse name: None  . Number of children: 1  . Years of education: None  . Highest education level: None  Social Needs  . Financial resource strain: None  . Food insecurity - worry: None  . Food insecurity - inability: None  . Transportation needs - medical: None  . Transportation needs - non-medical: None  Occupational History  . Occupation: disabled  Tobacco Use  . Smoking status: Never Smoker  . Smokeless tobacco: Never Used  Substance and Sexual Activity  . Alcohol use: Yes    Comment: one every few months  . Drug use: No    Comment: CBD oil  . Sexual activity: None  Other Topics Concern  . None  Social History Narrative   Patient lives with wife and daughter. Patient on disability    Family History  Problem Relation Age of Onset  . Lung disease Father   . Stroke Father       Review of Systems: Pertinent positive and negative review of systems were noted in the above HPI section. All other review of systems were otherwise negative.    Physical Exam: General: Well developed, well nourished, no acute distress Head: Normocephalic  and atraumatic Eyes:  sclerae anicteric, EOMI Ears: Normal auditory acuity Mouth: No deformity or lesions Neck: Supple, no masses or thyromegaly Lungs: Clear throughout to auscultation Heart: Regular rate and rhythm; no murmurs, rubs or bruits Abdomen: Soft, RLQ tenderness and non distended. No masses, hepatosplenomegaly or hernias noted. Normal Bowel sounds Rectal: deferred to colonoscopy Musculoskeletal: Symmetrical with no gross deformities  Skin: No lesions on visible extremities Pulses:  Normal pulses noted Extremities: No clubbing, cyanosis, edema or deformities noted Neurological: Alert oriented x 4, grossly nonfocal Cervical Nodes:  No significant cervical adenopathy Inguinal  Nodes: No significant inguinal adenopathy Psychological:  Alert and cooperative. Normal mood and affect  Assessment and Recommendations:  1. Chronic diarrhea, chronic RLQ pain.  Rule out IBD, colorectal neoplasms, IBS.  Obtain CMP, CBC, TSH, tTG, IgA and a GI pathogen panel.  Begin dicyclomine 10 mg p.o. before meals.  Schedule abdominal/pelvic CT and colonoscopy. The risks (including bleeding, perforation, infection, missed lesions, medication reactions and possible hospitalization or surgery if complications occur), benefits, and alternatives to colonoscopy with possible biopsy and possible polypectomy were discussed with the patient and they consent to proceed.   2 . GERD.  Follow antireflux measures as needed.  Omeprazole 40 mg daily as needed.   cc: [place], Betty G, MD [address] [place], [place] [id number]

## 2017-08-21 LAB — TISSUE TRANSGLUTAMINASE, IGA: (tTG) Ab, IgA: 1 U/mL

## 2017-08-22 ENCOUNTER — Encounter: Payer: Self-pay | Admitting: Gastroenterology

## 2017-08-22 ENCOUNTER — Other Ambulatory Visit: Payer: Medicare Other

## 2017-08-22 DIAGNOSIS — R1031 Right lower quadrant pain: Secondary | ICD-10-CM

## 2017-08-22 DIAGNOSIS — R197 Diarrhea, unspecified: Secondary | ICD-10-CM | POA: Diagnosis not present

## 2017-08-25 ENCOUNTER — Inpatient Hospital Stay: Admission: RE | Admit: 2017-08-25 | Payer: Medicare Other | Source: Ambulatory Visit

## 2017-08-25 ENCOUNTER — Ambulatory Visit (INDEPENDENT_AMBULATORY_CARE_PROVIDER_SITE_OTHER)
Admission: RE | Admit: 2017-08-25 | Discharge: 2017-08-25 | Disposition: A | Discharge status: Home / Self Care | Payer: Medicare Other | Source: Ambulatory Visit | Attending: Gastroenterology | Admitting: Gastroenterology

## 2017-08-25 ENCOUNTER — Encounter: Payer: Self-pay | Admitting: Gastroenterology

## 2017-08-25 DIAGNOSIS — R197 Diarrhea, unspecified: Secondary | ICD-10-CM

## 2017-08-25 DIAGNOSIS — R1031 Right lower quadrant pain: Secondary | ICD-10-CM

## 2017-08-25 LAB — GASTROINTESTINAL PATHOGEN PANEL PCR
C. difficile Tox A/B, PCR: NOT DETECTED
Campylobacter, PCR: NOT DETECTED
Cryptosporidium, PCR: NOT DETECTED
E COLI (ETEC) LT/ST, PCR: NOT DETECTED
E COLI (STEC) STX1/STX2, PCR: NOT DETECTED
E coli 0157, PCR: NOT DETECTED
Giardia lamblia, PCR: NOT DETECTED
NOROVIRUS, PCR: NOT DETECTED
Rotavirus A, PCR: NOT DETECTED
SALMONELLA, PCR: NOT DETECTED
SHIGELLA, PCR: NOT DETECTED

## 2017-08-25 MED ORDER — IOPAMIDOL (ISOVUE-300) INJECTION 61%
100.0000 mL | Freq: Once | INTRAVENOUS | Status: AC | PRN
  Administered 2017-08-25: 100 mL via INTRAVENOUS

## 2017-08-28 ENCOUNTER — Encounter: Payer: Self-pay | Admitting: Family Medicine

## 2017-09-23 ENCOUNTER — Other Ambulatory Visit: Payer: Self-pay

## 2017-09-23 ENCOUNTER — Ambulatory Visit (AMBULATORY_SURGERY_CENTER): Payer: Self-pay | Admitting: *Deleted

## 2017-09-23 VITALS — Ht 68.0 in | Wt 208.0 lb

## 2017-09-23 DIAGNOSIS — R197 Diarrhea, unspecified: Secondary | ICD-10-CM

## 2017-09-23 MED ORDER — NA SULFATE-K SULFATE-MG SULF 17.5-3.13-1.6 GM/177ML PO SOLN: ORAL | 0 refills | Status: DC

## 2017-09-23 MED FILL — SUPREP BOWEL PREP KIT: 17.5-3.13-1 | 1 days supply | Qty: 354 | Fill #0

## 2017-09-23 NOTE — Progress Notes (Signed)
Patient denies any allergies to eggs or soy. Patient denies any problems with anesthesia/sedation. Patient denies any oxygen use at home. Patient denies taking any diet/weight loss medications or blood thinners. EMMI education assisgned to patient on colonoscopy, this was explained and instructions given to patient. 

## 2017-10-13 ENCOUNTER — Encounter: Payer: Self-pay | Admitting: Gastroenterology

## 2017-10-13 ENCOUNTER — Ambulatory Visit (AMBULATORY_SURGERY_CENTER): Payer: Medicare Other | Admitting: Gastroenterology

## 2017-10-13 VITALS — BP 103/74 | HR 69 | Temp 98.4°F | Resp 11 | Ht 68.0 in | Wt 208.0 lb

## 2017-10-13 DIAGNOSIS — E119 Type 2 diabetes mellitus without complications: Secondary | ICD-10-CM | POA: Diagnosis not present

## 2017-10-13 DIAGNOSIS — R1031 Right lower quadrant pain: Secondary | ICD-10-CM

## 2017-10-13 DIAGNOSIS — R197 Diarrhea, unspecified: Secondary | ICD-10-CM | POA: Diagnosis not present

## 2017-10-13 DIAGNOSIS — K219 Gastro-esophageal reflux disease without esophagitis: Secondary | ICD-10-CM | POA: Diagnosis not present

## 2017-10-13 DIAGNOSIS — R569 Unspecified convulsions: Secondary | ICD-10-CM | POA: Diagnosis not present

## 2017-10-13 MED ORDER — GLYCOPYRROLATE 2 MG PO TABS: 1.0000 mg | ORAL_TABLET | Freq: Two times a day (BID) | ORAL | 11 refills | Status: AC

## 2017-10-13 MED ORDER — SODIUM CHLORIDE 0.9 % IV SOLN: 500.0000 mL | Freq: Once | INTRAVENOUS | Status: DC

## 2017-10-13 MED FILL — GLYCOPYRROLATE 2 MG TABLET: 2 | 30 days supply | Qty: 30 | Fill #0

## 2017-10-13 NOTE — Progress Notes (Signed)
Report to PACU, RN, vss, BBS= Clear.  

## 2017-10-13 NOTE — Progress Notes (Signed)
Called to room to assist during endoscopic procedure.  Patient ID and intended procedure confirmed with present staff. Received instructions for my participation in the procedure from the performing physician.  

## 2017-10-13 NOTE — Op Note (Addendum)
[facility] Patient Name: Jimmy Castillo Procedure Date: 10/13/2017 2:25 PM MRN: [id number] Endoscopist: Meryl Dare , MD Age: 47 Referring MD:  Date of Birth: [birth year] Gender: Male Account #: [id number] Procedure:                Colonoscopy Indications:              Abdominal pain in the right lower quadrant, Chronic                            diarrhea Medicines:                Monitored Anesthesia Care Procedure:                Pre-Anesthesia Assessment:                           - Prior to the procedure, a History and Physical                            was performed, and patient medications and                            allergies were reviewed. The patient's tolerance of                            previous anesthesia was also reviewed. The risks                            and benefits of the procedure and the sedation                            options and risks were discussed with the patient.                            All questions were answered, and informed consent                            was obtained. Prior Anticoagulants: The patient has                            taken no previous anticoagulant or antiplatelet                            agents. ASA Grade Assessment: II - A patient with                            mild systemic disease. After reviewing the risks                            and benefits, the patient was deemed in                            satisfactory condition to undergo the procedure.  After obtaining informed consent, the colonoscope                            was passed under direct vision. Throughout the                            procedure, the patient's blood pressure, pulse, and                            oxygen saturations were monitored continuously. The                            Colonoscope was introduced through the anus and                            advanced to the the terminal ileum, with                 identification of the appendiceal orifice and IC                            valve. The terminal ileum, ileocecal valve,                            appendiceal orifice, and rectum were photographed.                            The quality of the bowel preparation was excellent.                            The colonoscopy was performed without difficulty.                            The patient tolerated the procedure well. Scope In: 2:33:12 PM Scope Out: 2:42:42 PM Scope Withdrawal Time: 0 hours 8 minutes 35 seconds  Total Procedure Duration: 0 hours 9 minutes 30 seconds  Findings:                 The perianal and digital rectal examinations were                            normal.                           The terminal ileum appeared normal.                           Internal hemorrhoids were found during                            retroflexion. The hemorrhoids were small and Grade                            I (internal hemorrhoids that do not prolapse).  The exam was otherwise without abnormality on                            direct and retroflexion views. Random biopsies                            taken throughout. Complications:            No immediate complications. Estimated blood loss:                            None. Estimated Blood Loss:     Estimated blood loss: none. Impression:               - The examined portion of the ileum was normal.                           - Internal hemorrhoids.                           - The examination was otherwise normal on direct                            and retroflexion views. Recommendation:           - Repeat colonoscopy in 10 years for screening                            purposes.                           - Patient has a contact number available for                            emergencies. The signs and symptoms of potential                            delayed complications were discussed with the                             patient. Return to normal activities tomorrow.                            Written discharge instructions were provided to the                            patient.                           - Resume previous diet.                           - Continue present medications.                           - Glycopyrrolate 1 mg po bid as needed, 1 year of  refills.                           - Await pathology results.                           - Office appt in 2 months. Meryl Dare, MD 10/13/2017 2:46:52 PM This report has been signed electronically.

## 2017-10-13 NOTE — Patient Instructions (Signed)
YOU HAD AN ENDOSCOPIC PROCEDURE TODAY AT THE [facility]:   Refer to the procedure report that was given to you for any specific questions about what was found during the examination.  If the procedure report does not answer your questions, please call your gastroenterologist to clarify.  If you requested that your care partner not be given the details of your procedure findings, then the procedure report has been included in a sealed envelope for you to review at your convenience later.  YOU SHOULD EXPECT: Some feelings of bloating in the abdomen. Passage of more gas than usual.  Walking can help get rid of the air that was put into your GI tract during the procedure and reduce the bloating. If you had a lower endoscopy (such as a colonoscopy or flexible sigmoidoscopy) you may notice spotting of blood in your stool or on the toilet paper. If you underwent a bowel prep for your procedure, you may not have a normal bowel movement for a few days.  Please Note:  You might notice some irritation and congestion in your nose or some drainage.  This is from the oxygen used during your procedure.  There is no need for concern and it should clear up in a day or so.  SYMPTOMS TO REPORT IMMEDIATELY:   Following lower endoscopy (colonoscopy or flexible sigmoidoscopy):  Excessive amounts of blood in the stool  Significant tenderness or worsening of abdominal pains  Swelling of the abdomen that is new, acute  Fever of 100F or higher   For urgent or emergent issues, a gastroenterologist can be reached at any hour by calling [phone].   DIET:  We do recommend a small meal at first, but then you may proceed to your regular diet.  Drink plenty of fluids but you should avoid alcoholic beverages for 24 hours.  ACTIVITY:  You should plan to take it easy for the rest of today and you should NOT DRIVE or use heavy machinery until tomorrow (because of the sedation medicines used during the test).     FOLLOW UP: Our staff will call the number listed on your records the next business day following your procedure to check on you and address any questions or concerns that you may have regarding the information given to you following your procedure. If we do not reach you, we will leave a message.  However, if you are feeling well and you are not experiencing any problems, there is no need to return our call.  We will assume that you have returned to your regular daily activities without incident.  If any biopsies were taken you will be contacted by phone or by letter within the next 1-3 weeks.  Please call us at [phone] if you have not heard about the biopsies in 3 weeks.    SIGNATURES/CONFIDENTIALITY: You and/or your care partner have signed paperwork which will be entered into your electronic medical record.  These signatures attest to the fact that that the information above on your After Visit Summary has been reviewed and is understood.  Full responsibility of the confidentiality of this discharge information lies with you and/or your care-partner.  Take your new medication twice per day as ordered by Dr. Russella DarStark.

## 2017-10-14 ENCOUNTER — Telehealth: Payer: Self-pay | Admitting: Family Medicine

## 2017-10-14 ENCOUNTER — Telehealth: Payer: Self-pay

## 2017-10-14 ENCOUNTER — Telehealth: Payer: Self-pay | Admitting: *Deleted

## 2017-10-14 ENCOUNTER — Encounter: Payer: Self-pay | Admitting: Family Medicine

## 2017-10-14 NOTE — Telephone Encounter (Signed)
Patient states that he is feeling fine and not having any problems.

## 2017-10-14 NOTE — Telephone Encounter (Signed)
No answer, left message to call if questions or concerns. 

## 2017-10-14 NOTE — Telephone Encounter (Signed)
  Follow up Call-  Call back number 10/13/2017  Post procedure Call Back phone  # [phone]  Permission to leave phone message Yes     Left message

## 2017-10-14 NOTE — Telephone Encounter (Signed)
Copied from CRM [phone]. Topic: General - Call Back - No Documentation >> Oct 14, 2017 12:39 PM Viviann SpareWhite, Selina wrote: Reason for CRM: Patient called and stated that he rec'd a call telling him to schedule an appt with Dr. [place] before he had his labs done on the 10/15/17 @ 9:15 am. Checked the pt file and did not see any note in reference to that. Spoke to [place] in the office and she did not see anything in the patient file telling him to make an appt before his lab appt. Please give patient a call to verify if he needs an appt with Dr. [place]Jordan.

## 2017-10-14 NOTE — Telephone Encounter (Signed)
Sent patient a message through [employer]. Patient was supposed to follow-up in 4 months with Dr. [place]Jordan, she does not have any labs scheduled for him. He need to follow-up.

## 2017-10-15 ENCOUNTER — Encounter: Payer: Self-pay | Admitting: Gastroenterology

## 2017-10-15 ENCOUNTER — Other Ambulatory Visit: Payer: Medicare Other

## 2017-10-20 ENCOUNTER — Ambulatory Visit: Payer: Medicare Other | Admitting: Family Medicine

## 2017-10-20 ENCOUNTER — Encounter: Payer: Self-pay | Admitting: Family Medicine

## 2017-10-20 ENCOUNTER — Ambulatory Visit (INDEPENDENT_AMBULATORY_CARE_PROVIDER_SITE_OTHER): Payer: Medicare Other | Admitting: Family Medicine

## 2017-10-20 VITALS — BP 124/80 | HR 77 | Temp 97.7°F | Resp 12 | Ht 68.0 in | Wt 208.5 lb

## 2017-10-20 DIAGNOSIS — R946 Abnormal results of thyroid function studies: Secondary | ICD-10-CM | POA: Diagnosis not present

## 2017-10-20 DIAGNOSIS — E782 Mixed hyperlipidemia: Secondary | ICD-10-CM | POA: Diagnosis not present

## 2017-10-20 DIAGNOSIS — E1149 Type 2 diabetes mellitus with other diabetic neurological complication: Secondary | ICD-10-CM

## 2017-10-20 DIAGNOSIS — Z6831 Body mass index (BMI) 31.0-31.9, adult: Secondary | ICD-10-CM | POA: Diagnosis not present

## 2017-10-20 DIAGNOSIS — E669 Obesity, unspecified: Secondary | ICD-10-CM | POA: Diagnosis not present

## 2017-10-20 LAB — T3, FREE: T3 FREE: 3 pg/mL (ref 2.3–4.2)

## 2017-10-20 LAB — T4, FREE: Free T4: 0.68 ng/dL (ref 0.60–1.60)

## 2017-10-20 LAB — HEMOGLOBIN A1C: Hgb A1c MFr Bld: 8.1 % — ABNORMAL HIGH (ref 4.6–6.5)

## 2017-10-20 LAB — TSH: TSH: 4.28 u[IU]/mL (ref 0.35–4.50)

## 2017-10-20 NOTE — Patient Instructions (Addendum)
A few things to remember from today's visit:   Type 2 diabetes mellitus with neurological complications (HCC) - Plan: Hemoglobin A1c, Fructosamine  Mixed hyperlipidemia - Plan: T3, free, T4, free, TSH  Abnormal thyroid function test - Plan: T3, free, T4, free, TSH  No changes today. Further recommendations will be given after lab results.  I will see you back in 6 months.   Please be sure medication list is accurate. If a new problem present, please set up appointment sooner than planned today.

## 2017-10-20 NOTE — Progress Notes (Signed)
HPI:   [name] is a 47 y.o. male, who is here today for 4 months follow up.   He was last seen on 06/16/17  Since his last OV he has followed with GI, he had his colonoscopy. On 08/20/2017 TSH ordered by Dr. Russella Dar was mildly abnormal. No history of hypothyroidism. He denies unusual fatigue, hot/cold intolerance, or changes in bowel habits. He is following with GI because persistent diarrhea. In the past I have recommended decreasing or discontinuing Metformin but he does not feel like medication is causing the problem.   TSH was 4.8.  Diabetes Mellitus II:   Currently on metformin 1000 mg twice daily and Glipizide 10 mg daily.. Last eye exam over a year ago. Checking BS's : He is checking BS at night between 8 and 9 PM, about an hour after dinner  He has no check BS in the past 2 months. Hypoglycemia:Denies  No side effects reported today. He denies abdominal pain, nausea, vomiting, polydipsia, polyuria, or polyphagia. No numbness, tingling, or burning.   Lab Results  Component Value Date   CREATININE 1.18 08/20/2017   BUN 16 08/20/2017   NA 139 08/20/2017   K 4.4 08/20/2017   CL 102 08/20/2017   CO2 26 08/20/2017    Lab Results  Component Value Date   HGBA1C 6.8 (H) 06/18/2017   Lab Results  Component Value Date   MICROALBUR 1.3 02/13/2017     Hyperlipidemia:  Currently on non pharmacologic treatment. Following a low fat diet: Yes.   Lab Results  Component Value Date   CHOL 125 06/18/2017   HDL 37.60 (L) 06/18/2017   LDLCALC 50 06/18/2017   TRIG 184.0 (H) 06/18/2017   CHOLHDL 3 06/18/2017     Review of Systems  Constitutional: Positive for fatigue (No more than usual). Negative for activity change, appetite change, fever and unexpected weight change.  HENT: Negative for nosebleeds, sore throat and trouble swallowing.   Eyes: Negative for redness and visual disturbance.  Respiratory: Negative for cough, shortness of breath  and wheezing.   Cardiovascular: Negative for chest pain, palpitations and leg swelling.  Gastrointestinal: Negative for abdominal pain, nausea and vomiting.       No changes in bowel habits.  Endocrine: Negative for cold intolerance, heat intolerance, polydipsia, polyphagia and polyuria.  Genitourinary: Negative for decreased urine volume, dysuria and hematuria.  Musculoskeletal: Negative for gait problem and myalgias.  Skin: Negative for rash and wound.  Neurological: Negative for syncope, weakness and headaches.  Psychiatric/Behavioral: Negative for confusion. The patient is nervous/anxious.       Current Outpatient Medications on File Prior to Visit  Medication Sig Dispense Refill  . glipiZIDE (GLUCOTROL) 10 MG tablet Take 1 tablet (10 mg total) by mouth daily before breakfast. 90 tablet 1  . glucose blood test strip Dispense according to insurance. Use to test blood sugar two times a day 100 each 5  . glycopyrrolate (ROBINUL) 2 MG tablet Take 0.5 tablets (1 mg total) by mouth 2 (two) times daily. 60 tablet 11  . Lancets 30G MISC Use to check blood sugar two times a day 100 each 5  . metFORMIN (GLUCOPHAGE) 1000 MG tablet Take 1,000 mg 2 (two) times daily with a meal by mouth.    Marland Kitchen omeprazole (PRILOSEC) 40 MG capsule Take 40 mg by mouth as needed.     No current facility-administered medications on file prior to visit.      Past Medical History:  Diagnosis Date  . Allergy   . Anxiety   . Depression   . Diabetes mellitus without complication (HCC)   . Seizures (HCC)    "about 10 months ago" per pt   Allergies  Allergen Reactions  . Codeine Other (See Comments)    hallucinations  . Other Other (See Comments)    Pt allergic to all opiates - says has "weird effects on him"  . Pork-Derived Products     Social History   Socioeconomic History  . Marital status: Married    Spouse name: None  . Number of children: 1  . Years of education: None  . Highest education level:  None  Social Needs  . Financial resource strain: None  . Food insecurity - worry: None  . Food insecurity - inability: None  . Transportation needs - medical: None  . Transportation needs - non-medical: None  Occupational History  . Occupation: disabled  Tobacco Use  . Smoking status: Never Smoker  . Smokeless tobacco: Never Used  Substance and Sexual Activity  . Alcohol use: Yes    Comment: one every few months  . Drug use: Yes    Types: Other-see comments, Marijuana    Comment: CBD oil-not using per pt at this time  . Sexual activity: None  Other Topics Concern  . None  Social History Narrative   Patient lives with wife and daughter. Patient on disability     Vitals:   10/20/17 0821  BP: 124/80  Pulse: 77  Resp: 12  Temp: 97.7 F (36.5 C)  SpO2: 98%   Body mass index is 31.7 kg/m. Wt Readings from Last 3 Encounters:  10/20/17 208 lb 8 oz (94.6 kg)  10/13/17 208 lb (94.3 kg)  09/23/17 208 lb (94.3 kg)     Physical Exam  Nursing note and vitals reviewed. Constitutional: He is oriented to person, place, and time. He appears well-developed. No distress.  HENT:  Head: Normocephalic and atraumatic.  Mouth/Throat: Oropharynx is clear and moist and mucous membranes are normal.  Eyes: Conjunctivae are normal. Pupils are equal, round, and reactive to light.  Neck: No tracheal deviation present. No thyroid mass and no thyromegaly present.  Cardiovascular: Normal rate and regular rhythm.  No murmur heard. Pulses:      Dorsalis pedis pulses are 2+ on the right side, and 2+ on the left side.  Respiratory: Effort normal and breath sounds normal. No respiratory distress.  GI: Soft. He exhibits no mass. There is no hepatomegaly. There is no tenderness.  Musculoskeletal: He exhibits no edema or tenderness.  Lymphadenopathy:    He has no cervical adenopathy.  Neurological: He is alert and oriented to person, place, and time. He has normal strength. Gait normal.  Skin: Skin  is warm. No rash noted. No erythema.  Psychiatric: His mood appears anxious.  Fairly groomed, good eye contact.   Foot exam 02/2017  ASSESSMENT AND PLAN:   Mr. Jimmy Castillo was seen today for 4 months follow-up.   Diagnoses and all orders for this visit:  Lab Results  Component Value Date   HGBA1C 8.1 (H) 10/20/2017   Lab Results  Component Value Date   TSH 4.28 10/20/2017   Type 2 diabetes mellitus with neurological complications (HCC)  HgA1C pending today. No changes in current management. Regular exercise and healthy diet with avoidance of added sugar food intake is an important part of treatment and recommended. Annual eye exam, periodic dental and foot care recommended. F/U in  5-6 months  -     Hemoglobin A1c -     Fructosamine  Mixed hyperlipidemia  He is not fasting today, so we will plan on FLP next visit. Low-fat diet recommended for now.  -     T3, free -     T4, free -     TSH  Abnormal thyroid function test  Further recommendation will be given according to lab results.  -     T3, free -     T4, free -     TSH  Class 1 obesity with serious comorbidity and body mass index (BMI) of 31.0 to 31.9 in adult, unspecified obesity type  Weight has been stable. We discussed benefits of wt loss as well as adverse effects of obesity. Consistency with healthy diet and physical activity recommended.     -Mr. Jimmy Castillo was advised to return sooner than planned today if new concerns arise.       Jimmy Castillo G. [place], MD  [facility]. [place] office.

## 2017-10-22 LAB — FRUCTOSAMINE: FRUCTOSAMINE: 284 umol/L — AB (ref 190–270)

## 2017-10-23 ENCOUNTER — Encounter: Payer: Self-pay | Admitting: Gastroenterology

## 2017-10-26 ENCOUNTER — Encounter: Payer: Self-pay | Admitting: Family Medicine

## 2017-11-07 MED FILL — glipiZIDE 10 MG TABS: 10 | 90 days supply | Qty: 90 | Fill #0

## 2017-11-24 DIAGNOSIS — H524 Presbyopia: Secondary | ICD-10-CM | POA: Diagnosis not present

## 2017-11-24 DIAGNOSIS — E119 Type 2 diabetes mellitus without complications: Secondary | ICD-10-CM | POA: Diagnosis not present

## 2017-11-24 DIAGNOSIS — H52223 Regular astigmatism, bilateral: Secondary | ICD-10-CM | POA: Diagnosis not present

## 2017-11-24 DIAGNOSIS — H5213 Myopia, bilateral: Secondary | ICD-10-CM | POA: Diagnosis not present

## 2017-11-24 DIAGNOSIS — Z7984 Long term (current) use of oral hypoglycemic drugs: Secondary | ICD-10-CM | POA: Diagnosis not present

## 2017-11-24 LAB — HM DIABETES EYE EXAM

## 2017-11-27 ENCOUNTER — Encounter: Payer: Self-pay | Admitting: Family Medicine

## 2017-11-27 MED FILL — GLYCOPYRROLATE 2 MG TABLET: 2 | 30 days supply | Qty: 30 | Fill #1

## 2017-11-30 IMAGING — MR MR LUMBAR SPINE W/O CM
4 of 5 series · 19 of 48 positions shown · non-contrast
Comparison: Radiography 03/06/2017

CLINICAL DATA: Low back pain with numbness in both legs. Fifteen
years duration by history.

EXAM:
MRI LUMBAR SPINE WITHOUT CONTRAST
TECHNIQUE: Multiplanar, multisequence MR imaging of the lumbar spine was
performed. No intravenous contrast was administered.

[Series 6: T2 · sagittal · 4.0mm · 0.73mm/px · 6 of 15 slices shown (1 of 2)]
[im 1/15]
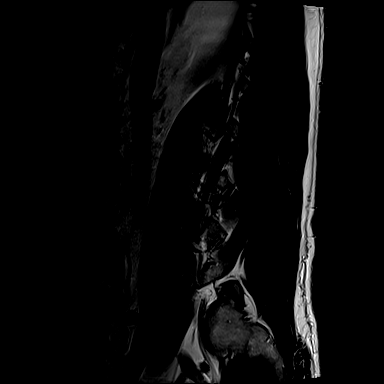
[im 3/15]
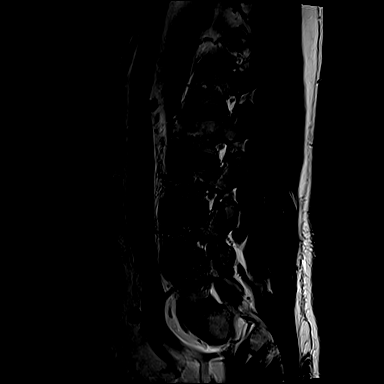
[im 6/15]
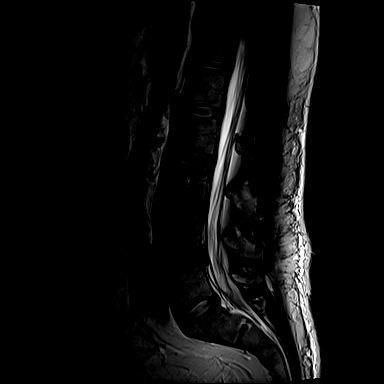
[im 9/15]
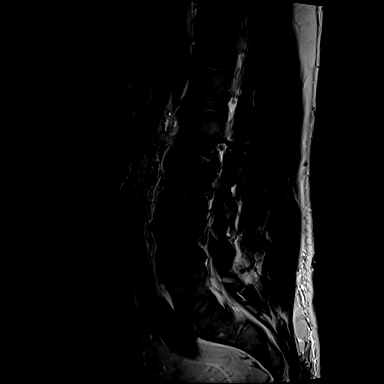
[im 12/15]
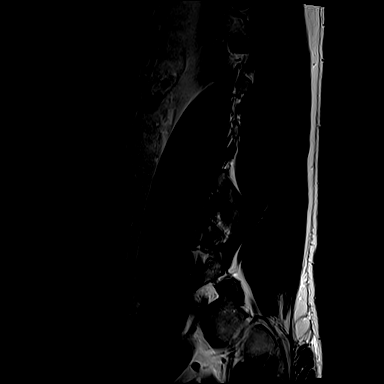
[im 15/15]
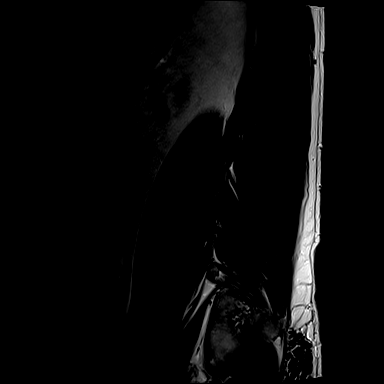

[Series 7: T1 · sagittal · 4.0mm · 0.73mm/px · 3 of 15 slices shown (1 of 2)]
[im 3/15]
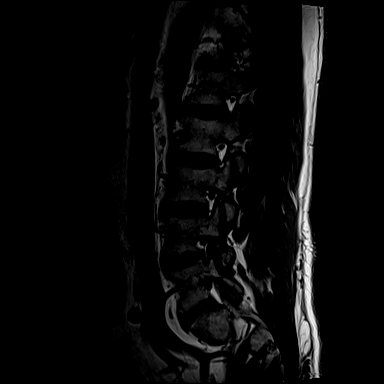
[im 9/15]
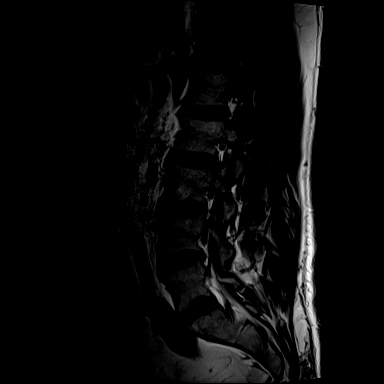
[im 15/15]
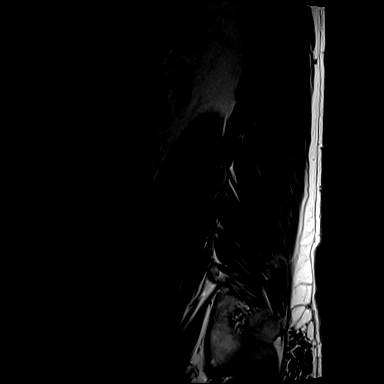

[Series 11: T1 · axial · 4.0mm · 0.28mm/px · z∈[-38,+108]mm · 3 of 35 slices shown (2 of 2)]
[im 5/35]
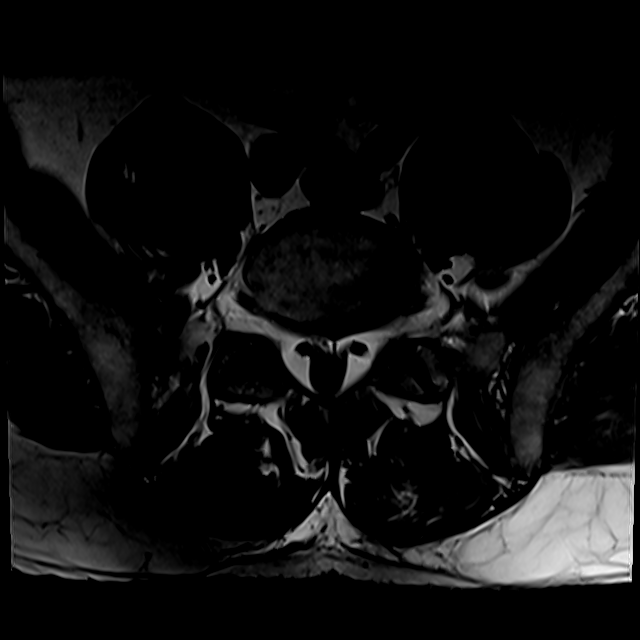
[im 18/35]
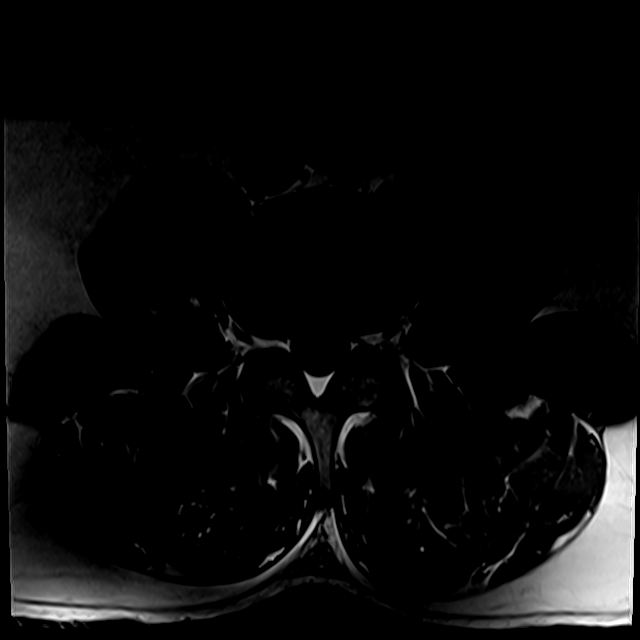
[im 30/35]
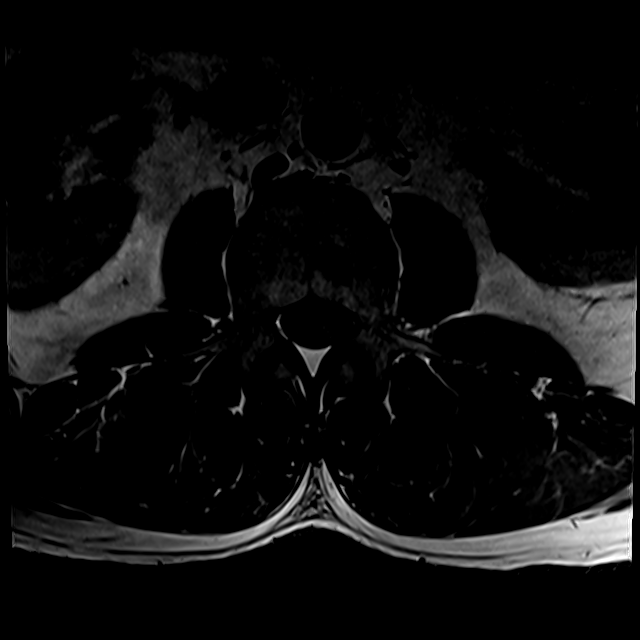

[Series 14: T2 · axial · 4.0mm · 0.28mm/px · z∈[-58,+108]mm · 7 of 35 slices shown (2 of 2)]
[im 1/35]
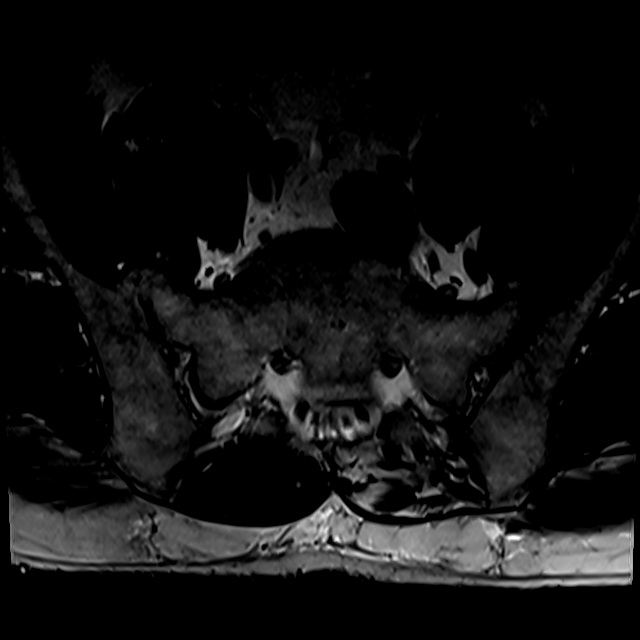
[im 5/35]
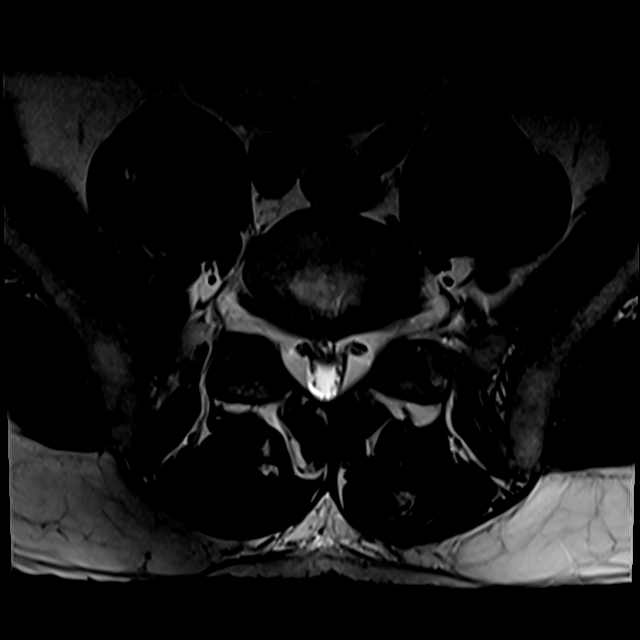
[im 10/35]
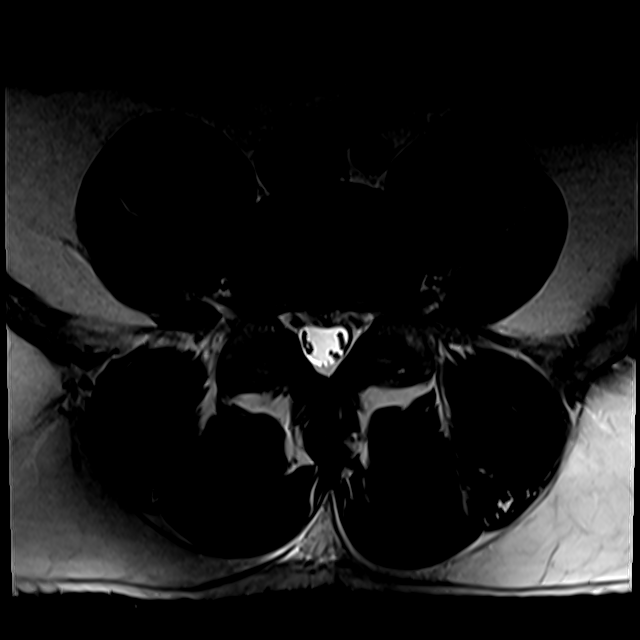
[im 15/35]
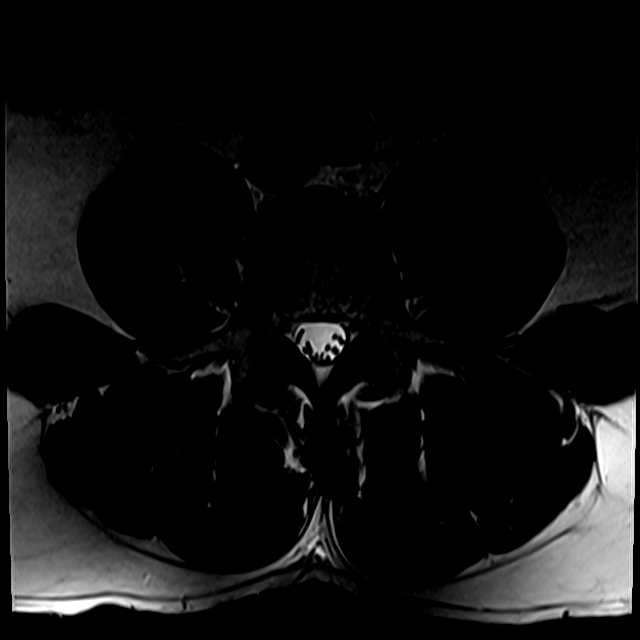
[im 18/35]
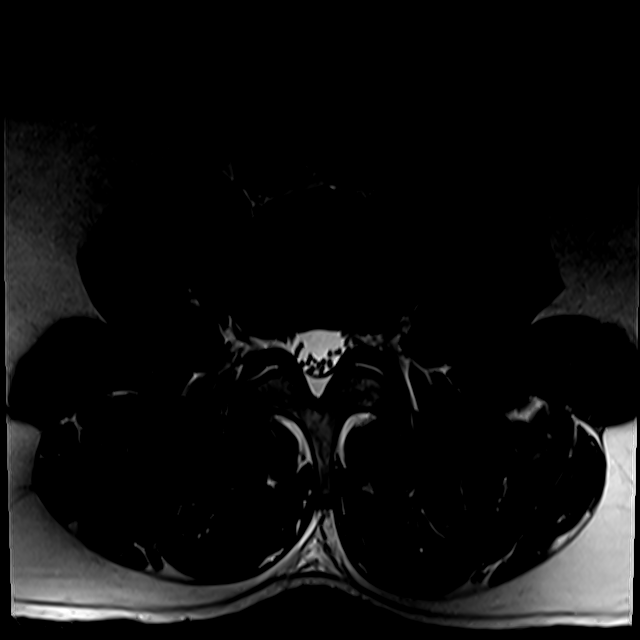
[im 20/35]
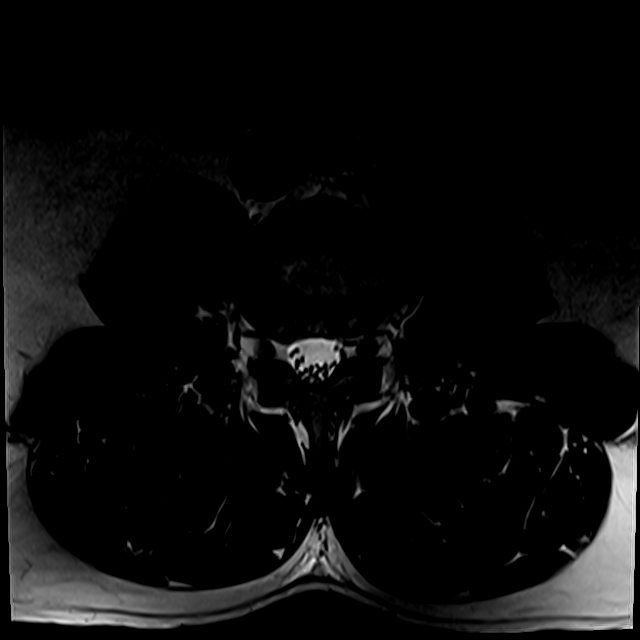
[im 30/35]
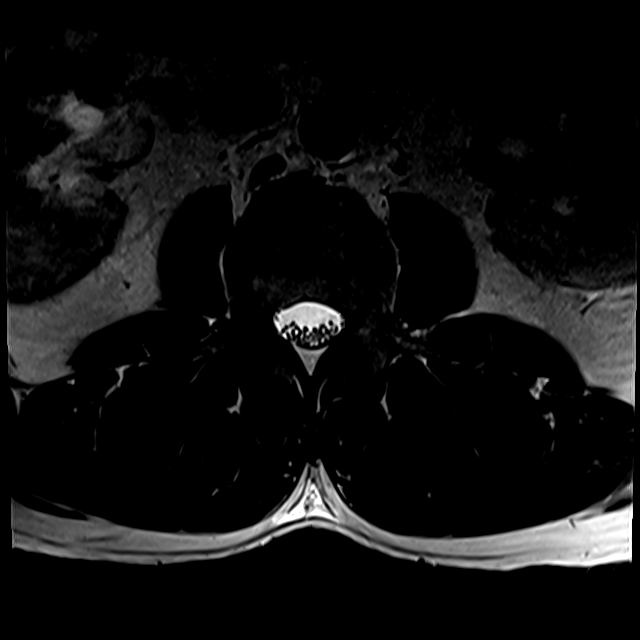

[19 of 48 positions shown; findings below may reference images not displayed]

FINDINGS: Segmentation:  5 lumbar type vertebral bodies.

Alignment:  Normal

Vertebrae:  Normal

Conus medullaris: Extends to the L1 level and appears normal.

Paraspinal and other soft tissues: Normal

Disc levels:

No abnormality at L3-4 and above.

L4-5: Disc degeneration with annular fissures and annular bulging.
This contacts the thecal sac but does not appear to cause neural
compression. Mild facet osteoarthritis at this level.

L5-S1: Normal interspace.
IMPRESSION: Single level pathology at L4-5. The DISH shows degeneration with
annular tearing and annular bulging. No apparent stenosis or neural
compression however. Mild bilateral facet osteoarthritis could also
contribute to low back pain.

## 2018-01-02 MED FILL — GLYCOPYRROLATE 2 MG TABLET: 2 | 30 days supply | Qty: 30 | Fill #2

## 2018-01-29 ENCOUNTER — Encounter: Payer: Self-pay | Admitting: Family Medicine

## 2018-02-26 NOTE — Progress Notes (Signed)
HPI:   [name] isKeats Castillo. male, who is here today with his wife for 5 months follow up.   He was last seen on 10/20/17.   Diabetes Mellitus II:   He was on Glipizide 10 mg daily and Metformin 1000 mg bid.Marland Kitchen He states that medications "were not doing anything",he stopped medications and BS's did not change,no less than 200. His wife states that he was "crashing" when he was on Glipizide his BS's low, he adds that he was "almost comatose."   Checking BS's : 170's-200's Hypoglycemia:50's a few times while he was on Glipizide. He is following diet recommendations.  He tells me that he has to exercise "as a magnet,"starve myself""abused myself" in order to get BS's in the low 100's. When "I tried to control my sugar I got unconscious" Denies LOC.  Using "eatable" marijuana since Christmas and his BS's "stabilized" in the 200's and feels "beautifully"  Negative for abdominal pain, nausea, vomiting, polydipsia, polyuria, or polyphagia. No numbness, tingling, or burning.   Lab Results  Component Value Date   CREATININE 1.18 08/20/2017   BUN 16 08/20/2017   NA 139 08/20/2017   K 4.4 08/20/2017   CL 102 08/20/2017   CO2 26 08/20/2017    Lab Results  Component Value Date   HGBA1C 8.1 (H) 10/20/2017   Lab Results  Component Value Date   MICROALBUR 1.3 02/13/2017     Hyperlipidemia:  Currently on non pharmacologic treatment. Following a low fat diet: yes.   Lab Results  Component Value Date   CHOL 125 06/18/2017   HDL 37.60 (L) 06/18/2017   LDLCALC 50 06/18/2017   TRIG 184.0 (H) 06/18/2017   CHOLHDL 3 06/18/2017       He is also c/o since he went camping, while hiking fell on his back on some branches. States that his back felt better when he was sleeping on the floor,while camping.   He follows with ortho and he would like to get a "second opinion." Also c/o left hip and left ankle pain,both he has had for years. States that he has  been told "nothing is wrong."   He is on disability for back pain and per wife report for "anxiety."  Also c/o "gluten intolerance", bloating sensation, diarrhea. No blood in stool. States that bread from the farmers market do not cause symptoms. No fever,chills,nausea,or vomiting.  Colonoscopy in 10/2017 negative otherwise except for internal hemorrhoids.    Review of Systems  Constitutional: Positive for fatigue. Negative for activity change, appetite change and fever.  HENT: Negative for nosebleeds, sore throat and trouble swallowing.   Eyes: Negative for redness and visual disturbance.  Respiratory: Negative for cough, shortness of breath and wheezing.   Cardiovascular: Negative for chest pain, palpitations and leg swelling.  Gastrointestinal: Positive for diarrhea. Negative for abdominal pain, nausea and vomiting.  Endocrine: Negative for polydipsia, polyphagia and polyuria.  Genitourinary: Negative for decreased urine volume, dysuria and hematuria.  Musculoskeletal: Positive for arthralgias and back pain.  Skin: Negative for rash and wound.  Neurological: Negative for syncope, weakness and headaches.  Psychiatric/Behavioral: Negative for confusion. The patient is nervous/anxious.      Current Outpatient Medications on File Prior to Visit  Medication Sig Dispense Refill  . glipiZIDE (GLUCOTROL) 10 MG tablet Take 1 tablet (10 mg total) by mouth daily before breakfast. 90 tablet 1  . glucose blood test strip Dispense according to insurance. Use to test blood sugar  two times a day 100 each 5  . glycopyrrolate (ROBINUL) 2 MG tablet Take 0.5 tablets (1 mg total) by mouth 2 (two) times daily. 60 tablet 11  . Lancets 30G MISC Use to check blood sugar two times a day 100 each 5  . metFORMIN (GLUCOPHAGE) 1000 MG tablet Take 1,000 mg 2 (two) times daily with a meal by mouth.    Marland Kitchen omeprazole (PRILOSEC) 40 MG capsule Take 40 mg by mouth as needed.     No current facility-administered  medications on file prior to visit.      Past Medical History:  Diagnosis Date  . Allergy   . Anxiety   . Depression   . Diabetes mellitus without complication (HCC)   . Seizures (HCC)    "about 10 months ago" per pt   Allergies  Allergen Reactions  . Codeine Other (See Comments)    hallucinations  . Other Other (See Comments)    Pt allergic to all opiates - says has "weird effects on him"  . Pork-Derived Products     Social History   Socioeconomic History  . Marital status: Married    Spouse name: Not on file  . Number of children: 1  . Years of education: Not on file  . Highest education level: Not on file  Occupational History  . Occupation: disabled  Social Needs  . Financial resource strain: Not on file  . Food insecurity:    Worry: Not on file    Inability: Not on file  . Transportation needs:    Medical: Not on file    Non-medical: Not on file  Tobacco Use  . Smoking status: Never Smoker  . Smokeless tobacco: Never Used  Substance and Sexual Activity  . Alcohol use: Yes    Comment: one every few months  . Drug use: Yes    Types: Other-see comments, Marijuana  . Sexual activity: Not on file  Lifestyle  . Physical activity:    Days per week: Not on file    Minutes per session: Not on file  . Stress: Not on file  Relationships  . Social connections:    Talks on phone: Not on file    Gets together: Not on file    Attends religious service: Not on file    Active member of club or organization: Not on file    Attends meetings of clubs or organizations: Not on file    Relationship status: Not on file  Other Topics Concern  . Not on file  Social History Narrative   Patient lives with wife and daughter. Patient on disability     Vitals:   02/27/18 0917  BP: 110/70  Pulse: 67  Resp: 12  Temp: 97.9 F (36.6 C)  SpO2: 97%   Body mass index is 29.08 kg/m.   Physical Exam  Nursing note and vitals reviewed. Constitutional: He is oriented to  person, place, and time. He appears well-developed. No distress.  HENT:  Head: Normocephalic and atraumatic.  Mouth/Throat: Oropharynx is clear and moist and mucous membranes are normal.  Eyes: Pupils are equal, round, and reactive to light. Conjunctivae are normal.  Cardiovascular: Normal rate and regular rhythm.  No murmur heard. Pulses:      Dorsalis pedis pulses are 2+ on the right side, and 2+ on the left side.  Respiratory: Effort normal and breath sounds normal. No respiratory distress.  GI: Soft. He exhibits no mass. There is no hepatomegaly. There is no tenderness.  Musculoskeletal: He exhibits edema (Trace pitting edema LE, bilateral.).  Lymphadenopathy:    He has no cervical adenopathy.  Neurological: He is alert and oriented to person, place, and time. He has normal strength.  Skin: Skin is warm. No rash noted. No erythema.  Psychiatric: He has a normal mood and affect. Cognition and memory are normal.  Fairly groomed, good eye contact.   Diabetic Foot Exam - Simple   Simple Foot Form Diabetic Foot exam was performed with the following findings:  Yes 02/27/2018 10:12 AM  Visual Inspection See comments:  Yes Sensation Testing Intact to touch and monofilament testing bilaterally:  Yes See comments:  Yes Pulse Check Posterior Tibialis and Dorsalis pulse intact bilaterally:  Yes Comments Irregular surface,short toenails        ASSESSMENT AND PLAN:   Mr. Jimmy Castillo [facility] was seen today for 5 months follow-up.  Orders Placed This Encounter  Procedures  . Basic metabolic panel  . Hemoglobin A1c  . Lipid panel  . Ambulatory referral to Endocrinology  . Ambulatory referral to Orthopedic Surgery   Lab Results  Component Value Date   CREATININE 0.96 02/27/2018   BUN 11 02/27/2018   NA 138 02/27/2018   K 4.5 02/27/2018   CL 106 02/27/2018   CO2 25 02/27/2018   Lab Results  Component Value Date   CHOL 145 02/27/2018   HDL 38.60 (L) 02/27/2018   LDLCALC 83  02/27/2018   TRIG 113.0 02/27/2018   CHOLHDL 4 02/27/2018   Lab Results  Component Value Date   HGBA1C 8.5 (H) 02/27/2018     1. Type 2 diabetes mellitus with neurological complications (HCC)  HgA1C pending today,it has not been at goal. He is not taking medications,he does not think medications will help,he agrees with endocrine evaluation but states that he does not think will help either.  Regular exercise as tolerated and healthy diet with avoidance of added sugar food intake is an important part of treatment and recommended. Annual eye exam, periodic dental and foot care recommended.  - Basic metabolic panel - Hemoglobin A1c - Ambulatory referral to Endocrinology  2. Mixed hyperlipidemia  He is not on statin. Continue low fat diet for now. Further recommendations will be given according to results.   The 10-year ASCVD risk score Denman George DC Montez Hageman., et al., 2013) is: 3.4%   Values used to calculate the score:     Age: 71 years     Sex: Male     Is Non-Hispanic African American: No     Diabetic: Yes     Tobacco smoker: No     Systolic Blood Pressure: 110 mmHg     Is BP treated: No     HDL Cholesterol: 38.6 mg/dL     Total Cholesterol: 145 mg/dL  - Lipid panel  3. Chronic bilateral low back pain with sciatica, sciatica laterality unspecified  Lumbar MRI 03/2017 single level pathology L4-5. No apparent stenosis or neutral compression.Mild facet OA. Discussed prognosis. Instructed to avoid activities that aggravate pain. Referral to ortho placed as requested.  - Ambulatory referral to Orthopedic Surgery  4. Chronic diarrhea  He is following with GI. He has identified some trigger factors,so avoid their intake.      Shaely Gadberry G. [place], MD  [facility]. [place] office.

## 2018-02-27 ENCOUNTER — Encounter: Payer: Self-pay | Admitting: Family Medicine

## 2018-02-27 ENCOUNTER — Ambulatory Visit (INDEPENDENT_AMBULATORY_CARE_PROVIDER_SITE_OTHER): Payer: Medicare Other | Admitting: Family Medicine

## 2018-02-27 VITALS — BP 110/70 | HR 67 | Temp 97.9°F | Resp 12 | Ht 68.0 in | Wt 191.2 lb

## 2018-02-27 DIAGNOSIS — E1149 Type 2 diabetes mellitus with other diabetic neurological complication: Secondary | ICD-10-CM | POA: Diagnosis not present

## 2018-02-27 DIAGNOSIS — K529 Noninfective gastroenteritis and colitis, unspecified: Secondary | ICD-10-CM | POA: Diagnosis not present

## 2018-02-27 DIAGNOSIS — G8929 Other chronic pain: Secondary | ICD-10-CM

## 2018-02-27 DIAGNOSIS — E782 Mixed hyperlipidemia: Secondary | ICD-10-CM

## 2018-02-27 DIAGNOSIS — M5440 Lumbago with sciatica, unspecified side: Secondary | ICD-10-CM | POA: Diagnosis not present

## 2018-02-27 LAB — LIPID PANEL
CHOL/HDL RATIO: 4
Cholesterol: 145 mg/dL (ref 0–200)
HDL: 38.6 mg/dL — ABNORMAL LOW (ref >39.00)
LDL Cholesterol: 83 mg/dL (ref 0–99)
NonHDL: 106.06
TRIGLYCERIDES: 113 mg/dL (ref 0.0–149.0)
VLDL: 22.6 mg/dL (ref 0.0–40.0)

## 2018-02-27 LAB — BASIC METABOLIC PANEL
BUN: 11 mg/dL (ref 6–23)
CALCIUM: 9.2 mg/dL (ref 8.4–10.5)
CO2: 25 mEq/L (ref 19–32)
CREATININE: 0.96 mg/dL (ref 0.40–1.50)
Chloride: 106 mEq/L (ref 96–112)
GFR: 88.64 mL/min (ref >60.00)
GLUCOSE: 171 mg/dL — AB (ref 70–99)
Potassium: 4.5 mEq/L (ref 3.5–5.1)
Sodium: 138 mEq/L (ref 135–145)

## 2018-02-27 LAB — HEMOGLOBIN A1C: Hgb A1c MFr Bld: 8.5 % — ABNORMAL HIGH (ref 4.6–6.5)

## 2018-02-27 NOTE — Patient Instructions (Addendum)
A few things to remember from today's visit:   Type 2 diabetes mellitus with neurological complications (HCC) - Plan: Basic metabolic panel, Hemoglobin A1c, Ambulatory referral to Endocrinology  Mixed hyperlipidemia - Plan: Lipid panel  Chronic bilateral low back pain with sciatica, sciatica laterality unspecified - Plan: Ambulatory referral to Orthopedic Surgery   Please be sure medication list is accurate. If a new problem present, please set up appointment sooner than planned today.

## 2018-02-28 ENCOUNTER — Encounter: Payer: Self-pay | Admitting: Family Medicine

## 2018-04-27 ENCOUNTER — Encounter: Payer: Self-pay | Admitting: Endocrinology

## 2019-11-11 DIAGNOSIS — H5213 Myopia, bilateral: Secondary | ICD-10-CM | POA: Diagnosis not present

## 2020-01-04 ENCOUNTER — Emergency Department (HOSPITAL_COMMUNITY): Payer: Medicare Other

## 2020-01-04 ENCOUNTER — Emergency Department (HOSPITAL_COMMUNITY)
Admission: EM | Admit: 2020-01-04 | Discharge: 2020-01-04 | Disposition: A | Discharge status: Home / Self Care | Payer: Medicare Other | Attending: Emergency Medicine | Admitting: Emergency Medicine

## 2020-01-04 ENCOUNTER — Other Ambulatory Visit: Payer: Self-pay

## 2020-01-04 DIAGNOSIS — R109 Unspecified abdominal pain: Secondary | ICD-10-CM

## 2020-01-04 DIAGNOSIS — E119 Type 2 diabetes mellitus without complications: Secondary | ICD-10-CM | POA: Diagnosis not present

## 2020-01-04 DIAGNOSIS — R197 Diarrhea, unspecified: Secondary | ICD-10-CM | POA: Diagnosis not present

## 2020-01-04 DIAGNOSIS — Z7984 Long term (current) use of oral hypoglycemic drugs: Secondary | ICD-10-CM | POA: Insufficient documentation

## 2020-01-04 DIAGNOSIS — R112 Nausea with vomiting, unspecified: Secondary | ICD-10-CM

## 2020-01-04 DIAGNOSIS — R1031 Right lower quadrant pain: Secondary | ICD-10-CM | POA: Diagnosis not present

## 2020-01-04 DIAGNOSIS — E860 Dehydration: Secondary | ICD-10-CM

## 2020-01-04 DIAGNOSIS — E1165 Type 2 diabetes mellitus with hyperglycemia: Secondary | ICD-10-CM | POA: Diagnosis not present

## 2020-01-04 DIAGNOSIS — R739 Hyperglycemia, unspecified: Secondary | ICD-10-CM

## 2020-01-04 LAB — BASIC METABOLIC PANEL
Anion gap: 10 (ref 5–15)
BUN: 14 mg/dL (ref 6–20)
CO2: 22 mmol/L (ref 22–32)
Calcium: 8.5 mg/dL — ABNORMAL LOW (ref 8.9–10.3)
Chloride: 110 mmol/L (ref 98–111)
Creatinine, Ser: 1.11 mg/dL (ref 0.61–1.24)
GFR calc Af Amer: >60 mL/min (ref >60)
GFR calc non Af Amer: >60 mL/min (ref >60)
Glucose, Bld: 194 mg/dL — ABNORMAL HIGH (ref 70–99)
Potassium: 5 mmol/L (ref 3.5–5.1)
Sodium: 142 mmol/L (ref 135–145)

## 2020-01-04 LAB — COMPREHENSIVE METABOLIC PANEL
ALT: 22 U/L (ref 0–44)
AST: 28 U/L (ref 15–41)
Albumin: 4.9 g/dL (ref 3.5–5.0)
Alkaline Phosphatase: 44 U/L (ref 38–126)
Anion gap: 20 — ABNORMAL HIGH (ref 5–15)
BUN: 15 mg/dL (ref 6–20)
CO2: 18 mmol/L — ABNORMAL LOW (ref 22–32)
Calcium: 9.7 mg/dL (ref 8.9–10.3)
Chloride: 103 mmol/L (ref 98–111)
Creatinine, Ser: 1.35 mg/dL — ABNORMAL HIGH (ref 0.61–1.24)
GFR calc Af Amer: >60 mL/min (ref >60)
GFR calc non Af Amer: >60 mL/min (ref >60)
Glucose, Bld: 312 mg/dL — ABNORMAL HIGH (ref 70–99)
Potassium: 3.5 mmol/L (ref 3.5–5.1)
Sodium: 141 mmol/L (ref 135–145)
Total Bilirubin: 1.2 mg/dL (ref 0.3–1.2)
Total Protein: 8 g/dL (ref 6.5–8.1)

## 2020-01-04 LAB — BLOOD GAS, VENOUS
Acid-base deficit: 4.1 mmol/L — ABNORMAL HIGH (ref 0.0–2.0)
Bicarbonate: 20.8 mmol/L (ref 20.0–28.0)
FIO2: 21
O2 Saturation: 79.3 %
Patient temperature: 98.6
pCO2, Ven: 39.7 mmHg — ABNORMAL LOW (ref 44.0–60.0)
pH, Ven: 7.34 (ref 7.250–7.430)
pO2, Ven: 47.1 mmHg — ABNORMAL HIGH (ref 32.0–45.0)

## 2020-01-04 LAB — CBC
HCT: 44.8 % (ref 39.0–52.0)
Hemoglobin: 16 g/dL (ref 13.0–17.0)
MCH: 31 pg (ref 26.0–34.0)
MCHC: 35.7 g/dL (ref 30.0–36.0)
MCV: 86.8 fL (ref 80.0–100.0)
Platelets: 259 10*3/uL (ref 150–400)
RBC: 5.16 MIL/uL (ref 4.22–5.81)
RDW: 11.9 % (ref 11.5–15.5)
WBC: 18.2 10*3/uL — ABNORMAL HIGH (ref 4.0–10.5)
nRBC: 0 % (ref 0.0–0.2)

## 2020-01-04 LAB — URINALYSIS, ROUTINE W REFLEX MICROSCOPIC
Bacteria, UA: NONE SEEN
Bilirubin Urine: NEGATIVE
Glucose, UA: >=500 mg/dL — AB
Hgb urine dipstick: NEGATIVE
Ketones, ur: 80 mg/dL — AB
Leukocytes,Ua: NEGATIVE
Nitrite: NEGATIVE
Protein, ur: 30 mg/dL — AB
Specific Gravity, Urine: 1.028 (ref 1.005–1.030)
pH: 5 (ref 5.0–8.0)

## 2020-01-04 LAB — ETHANOL: Alcohol, Ethyl (B): <10 mg/dL (ref <10)

## 2020-01-04 LAB — LIPASE, BLOOD: Lipase: 41 U/L (ref 11–51)

## 2020-01-04 LAB — CBG MONITORING, ED: Glucose-Capillary: 224 mg/dL — ABNORMAL HIGH (ref 70–99)

## 2020-01-04 MED ORDER — IOHEXOL 300 MG/ML  SOLN
100.0000 mL | Freq: Once | INTRAMUSCULAR | Status: AC | PRN
  Administered 2020-01-04: 100 mL via INTRAVENOUS

## 2020-01-04 MED ORDER — DICYCLOMINE HCL 20 MG PO TABS: 20.0000 mg | ORAL_TABLET | Freq: Two times a day (BID) | ORAL | 0 refills | Status: AC

## 2020-01-04 MED ORDER — PANTOPRAZOLE SODIUM 20 MG PO TBEC: 20.0000 mg | DELAYED_RELEASE_TABLET | Freq: Every day | ORAL | 0 refills | Status: AC

## 2020-01-04 MED ORDER — SODIUM CHLORIDE 0.9 % IV BOLUS
1000.0000 mL | Freq: Once | INTRAVENOUS | Status: AC
  Administered 2020-01-04: 1000 mL via INTRAVENOUS

## 2020-01-04 MED ORDER — DICYCLOMINE HCL 10 MG/ML IM SOLN
20.0000 mg | Freq: Once | INTRAMUSCULAR | Status: AC
  Administered 2020-01-04: 20 mg via INTRAMUSCULAR
  Filled 2020-01-04: qty 2

## 2020-01-04 MED ORDER — SODIUM CHLORIDE (PF) 0.9 % IJ SOLN
INTRAMUSCULAR | Status: AC
  Filled 2020-01-04: qty 50

## 2020-01-04 MED ORDER — INSULIN ASPART 100 UNIT/ML ~~LOC~~ SOLN
5.0000 [IU] | Freq: Once | SUBCUTANEOUS | Status: AC
  Administered 2020-01-04: 5 [IU] via SUBCUTANEOUS
  Filled 2020-01-04: qty 0.05

## 2020-01-04 MED ORDER — PANTOPRAZOLE SODIUM 40 MG IV SOLR
40.0000 mg | Freq: Once | INTRAVENOUS | Status: AC
  Administered 2020-01-04: 40 mg via INTRAVENOUS
  Filled 2020-01-04: qty 40

## 2020-01-04 MED ORDER — METOCLOPRAMIDE HCL 10 MG PO TABS: 10.0000 mg | ORAL_TABLET | Freq: Four times a day (QID) | ORAL | 0 refills | Status: AC | PRN

## 2020-01-04 MED ORDER — MORPHINE SULFATE (PF) 4 MG/ML IV SOLN
4.0000 mg | Freq: Once | INTRAVENOUS | Status: AC
  Administered 2020-01-04: 4 mg via INTRAVENOUS
  Filled 2020-01-04: qty 1

## 2020-01-04 MED ORDER — METFORMIN HCL 500 MG PO TABS: 500.0000 mg | ORAL_TABLET | Freq: Two times a day (BID) | ORAL | 0 refills | Status: AC

## 2020-01-04 MED ORDER — ONDANSETRON HCL 4 MG/2ML IJ SOLN
4.0000 mg | Freq: Once | INTRAMUSCULAR | Status: AC | PRN
  Administered 2020-01-04: 4 mg via INTRAVENOUS
  Filled 2020-01-04: qty 2

## 2020-01-04 MED ORDER — METOCLOPRAMIDE HCL 5 MG/ML IJ SOLN
10.0000 mg | Freq: Once | INTRAMUSCULAR | Status: AC
  Administered 2020-01-04: 10 mg via INTRAVENOUS
  Filled 2020-01-04: qty 2

## 2020-01-04 MED ORDER — SODIUM CHLORIDE 0.9% FLUSH
3.0000 mL | Freq: Once | INTRAVENOUS | Status: AC
  Administered 2020-01-04: 3 mL via INTRAVENOUS

## 2020-01-04 NOTE — ED Triage Notes (Signed)
As per EMS abd Pain @1000  3/29, Worse tonight

## 2020-01-04 NOTE — Discharge Instructions (Addendum)
I suspect you have a GI bug that is causing your symptoms.  Your CT scan is reassuring.  Use Reglan to help with nausea and vomiting, Protonix once daily to help with stomach irritation, and Bentyl to help with cramping abdominal pain.  Make sure you are drinking plenty of fluids.  Try and decrease your alcohol use.  Your blood sugar was also elevated today, please begin taking your Metformin twice daily like you are supposed to.

## 2020-01-04 NOTE — ED Provider Notes (Signed)
[facility] Provider Note   CSN: [id number] Arrival date & time: 01/04/20  0515     History Chief Complaint  Patient presents with  . Diarrhea  . Vomiting    Jimmy Castillo is a 49 y.o. male.  Jimmy Castillo is a 49 y.o. male with history of diabetes, seizures, chronic back pain, GERD, anxiety and depression, who presents to the emergency department for evaluation of abdominal pain.  Patient reports pain began yesterday around 10 AM, but got worse throughout the night.  He reports pain is generalized throughout the abdomen but is most severe in the right lower quadrant.  He reports numerous episodes of vomiting and diarrhea.  He denies any hematemesis, hematochezia or melena.  Denies fevers or chills.  Also reports pain in his back that is consistent with his typical chronic low back pain.  He denies any dysuria or urinary frequency, no flank pain.  No radiation of pain into the chest, no shortness of breath.  Patient does report that he has been using alcohol more regularly recently to try and medicate his chronic back pain.  He also states that he had been on a diet and so has not been taking his Metformin recently for his diabetes.  Reports has not been able to keep down any water and feels very thirsty now.  No other aggravating or alleviating factors.        Past Medical History:  Diagnosis Date  . Allergy   . Anxiety   . Depression   . Diabetes mellitus without complication (HCC)   . Seizures (HCC)    "about 10 months ago" per pt    Patient Active Problem List   Diagnosis Date Noted  . Chronic diarrhea 02/27/2018  . Class 1 obesity with body mass index (BMI) of 31.0 to 31.9 in adult 06/16/2017  . Hyperlipidemia 06/16/2017  . Chronic bilateral low back pain 02/13/2017  . GERD (gastroesophageal reflux disease) 02/13/2017  . 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 Type 2 diabetes mellitus with neurological complications [facility])      Past Surgical History:  Procedure Laterality Date  . INCISION AND DRAINAGE OF WOUND Left 07/23/2016   Procedure: IRRIGATION AND DEBRIDEMENT LEFT NECK ABSCESS;  Surgeon: Ovidio Kin, MD;  Location: [facility] OR;  Service: General;  Laterality: Left;  . WISDOM TOOTH EXTRACTION         Family History  Problem Relation Age of Onset  . Lung disease Father   . Stroke Father   . Colon cancer Neg Hx        pt unsure family hx.    Social History   Tobacco Use  . Smoking status: Never Smoker  . Smokeless tobacco: Never Used  Substance Use Topics  . Alcohol use: Yes    Comment: one every few months  . Drug use: Yes    Types: Other-see comments, Marijuana    Home Medications Prior to Admission medications   Medication Sig Start Date End Date Taking? Authorizing Provider  dicyclomine (BENTYL) 20 MG tablet Take 1 tablet (20 mg total) by mouth 2 (two) times daily. 01/04/20   Dartha Lodge, PA-C  glucose blood test strip Dispense according to insurance. Use to test blood sugar two times a day Patient not taking: Reported on 01/04/2020 12/31/16   Hyacinth Meeker, MD  glycopyrrolate (ROBINUL) 2 MG tablet Take 0.5 tablets (1 mg total) by mouth 2 (two) times daily. Patient not taking: Reported on 01/04/2020 10/13/17  Ladene Artist, MD  Lancets 30G MISC Use to check blood sugar two times a day Patient not taking: Reported on 01/04/2020 12/31/16   Dellia Nims, MD  metFORMIN (GLUCOPHAGE) 500 MG tablet Take 1 tablet (500 mg total) by mouth 2 (two) times daily with a meal. 01/04/20   Jacqlyn Larsen, PA-C  metoCLOPramide (REGLAN) 10 MG tablet Take 1 tablet (10 mg total) by mouth every 6 (six) hours as needed for nausea (nausea/headache). 01/04/20   Jacqlyn Larsen, PA-C  pantoprazole (PROTONIX) 20 MG tablet Take 1 tablet (20 mg total) by mouth daily. 01/04/20   Jacqlyn Larsen, PA-C    Allergies    Codeine, Other, and Pork-derived products  Review of Systems   Review of Systems  Constitutional:  Negative for chills and fever.  HENT: Negative.   Respiratory: Negative for cough and shortness of breath.   Cardiovascular: Negative for chest pain.  Gastrointestinal: Positive for abdominal pain, diarrhea, nausea and vomiting. Negative for blood in stool.  Genitourinary: Negative for dysuria and frequency.  Musculoskeletal: Negative for arthralgias and myalgias.  Skin: Negative for color change and rash.  Neurological: Negative for dizziness and syncope.    Physical Exam Updated Vital Signs BP (!) 145/91   Pulse (!) 105   Temp 98.1 F (36.7 C) (Oral)   Resp (!) 22   Ht 5\' 8"  (1.727 m)   Wt 81.6 kg   SpO2 99%   BMI 27.37 kg/m   Physical Exam Vitals and nursing note reviewed.  Constitutional:      General: He is not in acute distress.    Appearance: Normal appearance. He is well-developed and normal weight. He is ill-appearing. He is not diaphoretic.     Comments: Patient is alert, somewhat ill-appearing with active vomiting but in no acute distress  HENT:     Head: Normocephalic and atraumatic.     Mouth/Throat:     Mouth: Mucous membranes are dry.     Pharynx: Oropharynx is clear.     Comments: Mucous membranes slightly dry, posterior oropharynx is clear Eyes:     General:        Right eye: No discharge.        Left eye: No discharge.  Cardiovascular:     Rate and Rhythm: Normal rate and regular rhythm.     Heart sounds: Normal heart sounds. No murmur. No friction rub.  Pulmonary:     Effort: Pulmonary effort is normal. No respiratory distress.     Breath sounds: Normal breath sounds. No wheezing or rales.  Abdominal:     General: Bowel sounds are normal. There is no distension.     Palpations: Abdomen is soft. There is no mass.     Tenderness: There is abdominal tenderness. There is no guarding.     Comments: Abdomen is soft and nondistended, generalized tenderness noted throughout but patient reports pain is most severe with palpation in the right lower quadrant,  no guarding or peritoneal signs.  Musculoskeletal:        General: No deformity.     Cervical back: Neck supple.     Comments: Some tenderness over the low back musculature bilaterally but no focal midline tenderness or palpable deformity, no overlying skin changes  Skin:    General: Skin is warm and dry.     Capillary Refill: Capillary refill takes less than 2 seconds.  Neurological:     Mental Status: He is alert.     Coordination: Coordination  normal.     Comments: Speech is clear, able to follow commands Moves extremities without ataxia, coordination intact  Psychiatric:        Mood and Affect: Mood normal.        Behavior: Behavior normal.     ED Results / Procedures / Treatments   Labs (all labs ordered are listed, but only abnormal results are displayed) Labs Reviewed  COMPREHENSIVE METABOLIC PANEL - Abnormal; Notable for the following components:      Result Value   CO2 18 (*)    Glucose, Bld 312 (*)    Creatinine, Ser 1.35 (*)    Anion gap 20 (*)    All other components within normal limits  CBC - Abnormal; Notable for the following components:   WBC 18.2 (*)    All other components within normal limits  URINALYSIS, ROUTINE W REFLEX MICROSCOPIC - Abnormal; Notable for the following components:   Glucose, UA >=500 (*)    Ketones, ur 80 (*)    Protein, ur 30 (*)    All other components within normal limits  BLOOD GAS, VENOUS - Abnormal; Notable for the following components:   pCO2, Ven 39.7 (*)    pO2, Ven 47.1 (*)    Acid-base deficit 4.1 (*)    All other components within normal limits  BASIC METABOLIC PANEL - Abnormal; Notable for the following components:   Glucose, Bld 194 (*)    Calcium 8.5 (*)    All other components within normal limits  CBG MONITORING, ED - Abnormal; Notable for the following components:   Glucose-Capillary 224 (*)    All other components within normal limits  LIPASE, BLOOD  ETHANOL    EKG EKG  Interpretation  Date/Time:  Tuesday January 04 2020 06:03:41 EDT Ventricular Rate:  94 PR Interval:    QRS Duration: 88 QT Interval:  363 QTC Calculation: 454 R Axis:   76 Text Interpretation: Sinus rhythm Baseline wander in lead(s) V2 No significant change since last tracing 26 Feb 2015 Confirmed by Devoria Albe ([id number]) on 01/04/2020 6:20:54 AM   Radiology CT ABDOMEN PELVIS W CONTRAST  Result Date: 01/04/2020 CLINICAL DATA:  Right lower quadrant pain EXAM: CT ABDOMEN AND PELVIS WITH CONTRAST TECHNIQUE: Multidetector CT imaging of the abdomen and pelvis was performed using the standard protocol following bolus administration of intravenous contrast. CONTRAST:  OMNIPAQUE IOHEXOL 300 MG/ML  SOLN COMPARISON:  2018 FINDINGS: Lower chest: No acute abnormality. Hepatobiliary: Probable hepatic steatosis. Gallbladder is unremarkable. No biliary dilatation. Pancreas: Unremarkable. Spleen: Unremarkable. Adrenals/Urinary Tract: Adrenals, kidneys, and bladder are unremarkable. Stomach/Bowel: Stomach is within normal limits. The bowel is normal in caliber. Normal appendix. Vascular/Lymphatic: Minimal aortic atherosclerosis. No enlarged lymph nodes. Reproductive: Prostate is unremarkable. Other: No ascites.  Abdominal wall is unremarkable. Musculoskeletal: No new osseous abnormality. IMPRESSION: No acute abnormality or findings to account for reported symptoms. Probable hepatic steatosis. Electronically Signed   By: Guadlupe Spanish M.D.   On: 01/04/2020 09:46    Procedures Procedures (including critical care time)  Medications Ordered in ED Medications  sodium chloride (PF) 0.9 % injection (has no administration in time range)  sodium chloride flush (NS) 0.9 % injection 3 mL (3 mLs Intravenous Given 01/04/20 0603)  ondansetron (ZOFRAN) injection 4 mg (4 mg Intravenous Given 01/04/20 0602)  sodium chloride 0.9 % bolus 1,000 mL (0 mLs Intravenous Stopped 01/04/20 1140)  metoCLOPramide (REGLAN) injection 10  mg (10 mg Intravenous Given 01/04/20 0717)  morphine 4 MG/ML injection 4 mg (  4 mg Intravenous Given 01/04/20 0718)  sodium chloride 0.9 % bolus 1,000 mL (0 mLs Intravenous Stopped 01/04/20 1140)  iohexol (OMNIPAQUE) 300 MG/ML solution 100 mL (100 mLs Intravenous Contrast Given 01/04/20 0922)  pantoprazole (PROTONIX) injection 40 mg (40 mg Intravenous Given 01/04/20 1025)  dicyclomine (BENTYL) injection 20 mg (20 mg Intramuscular Given 01/04/20 1026)  insulin aspart (novoLOG) injection 5 Units (5 Units Subcutaneous Given 01/04/20 1025)    ED Course  I have reviewed the triage vital signs and the nursing notes.  Pertinent labs & imaging results that were available during my care of the patient were reviewed by me and considered in my medical decision making (see chart for details).    MDM Rules/Calculators/A&P                      49 year old male presents with nausea vomiting and diarrhea that began yesterday morning, symptoms worsened and on arrival patient is actively vomiting.  Mildly tachycardic but vitals otherwise normal.  Patient also endorses using alcohol more frequently recently to treat the chronic back pain he has been dealing with.  He localizes abdominal pain to the right side of his abdomen, denies history of previous abdominal surgeries.  No fevers.  Lab work initiated in triage also shows that patient is hyperglycemic at 312 with elevated anion gap of 20 and CO2 of 18.  Patient states that he has not recently been taking his Metformin, but I also feel that this could be multifactorial from dehydration and alcohol use.  Slight bump in kidney function.  Lab work otherwise shows a leukocytosis of 18.2, normal hemoglobin, patient has not reported any blood in his stools.  Urinalysis with 80 of ketones and greater than 500 glucose, no signs of infection.  Negative ethanol level.  Will get CT abdomen pelvis, VBG, give 2 L of IV fluids in addition to pain and nausea medicine and then  reevaluate.  CT scan is very reassuring and shows no acute abnormality within the abdomen or pelvis, he does have some probable hepatic steatosis.  I suspect gastroenteritis.  Medications vomiting has stopped and patient reports improvement in his symptoms.  He still has some mild generalized abdominal pain but no guarding or peritoneal signs on exam.  His VBG is reassuring with normal pH, bicarb has improved to 20, after 2 L of fluid will recheck BMP.  BP 129/70 (BP Location: Right Arm)   Pulse 95   Temp 98.1 F (36.7 C) (Oral)   Resp 15   Ht 5\' 8"  (1.727 m)   Wt 81.6 kg   SpO2 95%   BMI 27.37 kg/m   BMP 01/04/2020 01/04/2020  Glucose 194(H) 312(H)  BUN 14 15  Creatinine 1.11 1.35(H)  Sodium 142 141  Potassium 5.0 3.5  Chloride 110 103  CO2 22 18(L)  Calcium 8.5(L) 9.7  Anion Gap 10 20   Repeat BMP shows resolution of anion gap, improvement in glucose and elevated creatinine.  Will discharge patient home with nausea medicine, Protonix and Bentyl, will also represcribe patient's Metformin.  Discussed strict return precautions and discussed the importance of follow-up with his PCP.  Patient expresses understanding and agreement with plan.  He is discharged home in good condition.   Final Clinical Impression(s) / ED Diagnoses Final diagnoses:  Nausea vomiting and diarrhea  Right sided abdominal pain  Hyperglycemia  Dehydration    Rx / DC Orders ED Discharge Orders         Ordered  metFORMIN (GLUCOPHAGE) 500 MG tablet  2 times daily with meals     01/04/20 1244    metoCLOPramide (REGLAN) 10 MG tablet  Every 6 hours PRN     01/04/20 1244    pantoprazole (PROTONIX) 20 MG tablet  Daily     01/04/20 1244    dicyclomine (BENTYL) 20 MG tablet  2 times daily     01/04/20 1244           Dartha Lodge, [place] 01/07/20 1018    Virgina Norfolk, DO 01/07/20 1518

## 2020-11-02 ENCOUNTER — Telehealth: Payer: Self-pay | Admitting: Family Medicine

## 2020-11-02 NOTE — Telephone Encounter (Signed)
Tried calling pt to  schedule Medicare Annual Wellness Visit (AWV) either virtually or in office.   Last AWV no information   please schedule at anytime with [facility] Nurse Health Advisor 1 or 2   This should be a 45 minute visit.  Patient also needs pcp appointment
